# Patient Record
Sex: Male | Born: 1980 | Race: Black or African American | Hispanic: No | Marital: Married | Smoking: Current every day smoker
Health system: Southern US, Community
[De-identification: ages and names within clinical notes are randomized; demographics above are authoritative.]

## PROBLEM LIST (undated history)

## (undated) DIAGNOSIS — F329 Major depressive disorder, single episode, unspecified: Secondary | ICD-10-CM

## (undated) DIAGNOSIS — K219 Gastro-esophageal reflux disease without esophagitis: Secondary | ICD-10-CM

## (undated) DIAGNOSIS — E739 Lactose intolerance, unspecified: Secondary | ICD-10-CM

## (undated) DIAGNOSIS — F32A Depression, unspecified: Secondary | ICD-10-CM

## (undated) DIAGNOSIS — F319 Bipolar disorder, unspecified: Secondary | ICD-10-CM

## (undated) HISTORY — PX: HIP SURGERY: SHX245

## (undated) HISTORY — DX: Lactose intolerance, unspecified: E73.9

## (undated) HISTORY — DX: Gastro-esophageal reflux disease without esophagitis: K21.9

---

## 2011-08-20 ENCOUNTER — Emergency Department (HOSPITAL_COMMUNITY)
Admission: EM | Admit: 2011-08-20 | Discharge: 2011-08-20 | Disposition: A | Discharge status: Home / Self Care | Payer: Self-pay | Attending: Emergency Medicine | Admitting: Emergency Medicine

## 2011-08-20 ENCOUNTER — Emergency Department (HOSPITAL_COMMUNITY): Payer: Self-pay

## 2011-08-20 ENCOUNTER — Encounter (HOSPITAL_COMMUNITY): Payer: Self-pay | Admitting: Emergency Medicine

## 2011-08-20 DIAGNOSIS — R0789 Other chest pain: Secondary | ICD-10-CM

## 2011-08-20 DIAGNOSIS — F17200 Nicotine dependence, unspecified, uncomplicated: Secondary | ICD-10-CM | POA: Insufficient documentation

## 2011-08-20 DIAGNOSIS — R079 Chest pain, unspecified: Secondary | ICD-10-CM | POA: Insufficient documentation

## 2011-08-20 MED ORDER — IBUPROFEN 800 MG PO TABS
800.0000 mg | ORAL_TABLET | Freq: Once | ORAL | Status: AC
  Administered 2011-08-20: 800 mg via ORAL
  Filled 2011-08-20: qty 1

## 2011-08-20 MED ORDER — OXYCODONE-ACETAMINOPHEN 5-325 MG PO TABS: 1.0000 | ORAL_TABLET | ORAL | Status: AC | PRN

## 2011-08-20 MED ORDER — OXYCODONE-ACETAMINOPHEN 5-325 MG PO TABS
1.0000 | ORAL_TABLET | Freq: Once | ORAL | Status: AC
  Administered 2011-08-20: 1 via ORAL
  Filled 2011-08-20: qty 1
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
## 2011-08-20 NOTE — ED Notes (Signed)
Pt alert & oriented x4, stable gait. Pt given discharge instructions, paperwork & prescription(s). Patient instructed to stop at the registration desk to finish any additional paperwork. pt verbalized understanding. Pt left department w/ no further questions.  
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
## 2011-12-24 ENCOUNTER — Emergency Department (HOSPITAL_COMMUNITY): Payer: Self-pay

## 2011-12-24 ENCOUNTER — Emergency Department (HOSPITAL_COMMUNITY)
Admission: EM | Admit: 2011-12-24 | Discharge: 2011-12-24 | Disposition: A | Discharge status: Home / Self Care | Payer: Self-pay | Attending: Emergency Medicine | Admitting: Emergency Medicine

## 2011-12-24 ENCOUNTER — Encounter (HOSPITAL_COMMUNITY): Payer: Self-pay | Admitting: Emergency Medicine

## 2011-12-24 DIAGNOSIS — Y92838 Other recreation area as the place of occurrence of the external cause: Secondary | ICD-10-CM | POA: Insufficient documentation

## 2011-12-24 DIAGNOSIS — X500XXA Overexertion from strenuous movement or load, initial encounter: Secondary | ICD-10-CM | POA: Insufficient documentation

## 2011-12-24 DIAGNOSIS — Y9239 Other specified sports and athletic area as the place of occurrence of the external cause: Secondary | ICD-10-CM | POA: Insufficient documentation

## 2011-12-24 DIAGNOSIS — S6390XA Sprain of unspecified part of unspecified wrist and hand, initial encounter: Secondary | ICD-10-CM | POA: Insufficient documentation

## 2011-12-24 DIAGNOSIS — F17200 Nicotine dependence, unspecified, uncomplicated: Secondary | ICD-10-CM | POA: Insufficient documentation

## 2011-12-24 DIAGNOSIS — S63602A Unspecified sprain of left thumb, initial encounter: Secondary | ICD-10-CM

## 2011-12-24 DIAGNOSIS — Y9367 Activity, basketball: Secondary | ICD-10-CM | POA: Insufficient documentation

## 2011-12-24 MED ORDER — IBUPROFEN 800 MG PO TABS: 800.0000 mg | ORAL_TABLET | Freq: Three times a day (TID) | ORAL | Status: DC

## 2011-12-24 NOTE — ED Notes (Signed)
Pt c/o left thumb pain after blocking basketball yesterday.

## 2011-12-24 NOTE — ED Provider Notes (Signed)
History     CSN: [id number]  Arrival date & time 12/24/11  1191   First MD Initiated Contact with Patient 12/24/11 1033      Chief Complaint  Patient presents with  . Hand Pain    (Consider location/radiation/quality/duration/timing/severity/associated sxs/prior treatment) HPI Comments: Lawrence Whitaker presents with left thumb pain after a hyperextension injury during a basketball game yesterday.  He has used ice and tried to minimize use with no significant improvement in pain.  Pain is constant and worse with range of motion and palpation.  He denies radiation of pain.  Pt is right handed.  The history is provided by the patient.    History reviewed. No pertinent past medical history.  Past Surgical History  Procedure Date  . Hip surgery     No family history on file.  History  Substance Use Topics  . Smoking status: Current Every Day Smoker -- 1.0 packs/day  . Smokeless tobacco: Not on file  . Alcohol Use: Yes     Comment: occasionally      Review of Systems  Musculoskeletal: Positive for joint swelling and arthralgias.  Skin: Negative for wound.  Neurological: Negative for weakness and numbness.    Allergies  Review of patient's allergies indicates no known allergies.  Home Medications   Current Outpatient Rx  Name  Route  Sig  Dispense  Refill  . IBUPROFEN 800 MG PO TABS   Oral   Take 1 tablet (800 mg total) by mouth 3 (three) times daily.   21 tablet   0     BP 117/77  Pulse 69  Temp 98.2 F (36.8 C) (Oral)  Resp 18  Ht 6\' 3"  (1.905 m)  Wt 280 lb (127.007 kg)  BMI 35.00 kg/m2  SpO2 100%  Physical Exam  Constitutional: He appears well-developed and well-nourished.  HENT:  Head: Atraumatic.  Neck: Normal range of motion.  Cardiovascular:       Pulses equal bilaterally  Musculoskeletal: He exhibits tenderness.       Right shoulder: He exhibits tenderness and pain. He exhibits no deformity and normal pulse.       TTP along left thumb  proximal phalanx and mcp.  There is modest edema which limits complete ROM but pt able to perform resisted flexion and extension of the finger.  Distal sensation intact.  Less than 3 sec cap refill. No snuffbox ttp.  Neurological: He is alert. He has normal strength. He displays normal reflexes. No sensory deficit. He exhibits normal muscle tone.  Skin: Skin is warm and dry.  Psychiatric: He has a normal mood and affect.    ED Course  Procedures (including critical care time)  Labs Reviewed - No data to display Dg Finger Thumb Left  12/24/2011  *RADIOLOGY REPORT*  Clinical Data: History of possible injury with left thumb pain.  LEFT THUMB 2+V  Comparison: None.  Findings: Alignment is normal.  Joint spaces are preserved.  No fracture or dislocation is evident.  No soft tissue lesions are seen.  IMPRESSION: No fracture or dislocation is evident.   Original Report Authenticated By: Onalee Hua Call      1. Left thumb sprain       MDM  xrays reviewed and discussed with patient.  Thumb spica (velcro) applied by RN.  Encouraged ice, elevation,  Ibuprofen.  Recheck by pcp  if not improving over the next week.  Referral also given to Dr. Romeo Apple for a recheck in 1 week if not improving.  Burgess Amor, [place] 12/24/11 2222

## 2011-12-25 NOTE — ED Provider Notes (Signed)
Medical screening examination/treatment/procedure(s) were performed by non-physician practitioner and as supervising physician I was immediately available for consultation/collaboration.   Kais Monje, MD 12/25/11 0643 

## 2012-01-27 ENCOUNTER — Encounter (HOSPITAL_COMMUNITY): Payer: Self-pay | Admitting: *Deleted

## 2012-01-27 ENCOUNTER — Encounter (HOSPITAL_COMMUNITY): Payer: Self-pay | Admitting: Emergency Medicine

## 2012-01-27 ENCOUNTER — Emergency Department (HOSPITAL_COMMUNITY)
Admission: EM | Admit: 2012-01-27 | Discharge: 2012-01-27 | Disposition: A | Discharge status: Other Acute Inpatient Hospital | Payer: Self-pay | Attending: Emergency Medicine | Admitting: Emergency Medicine

## 2012-01-27 ENCOUNTER — Inpatient Hospital Stay (HOSPITAL_COMMUNITY)
Admission: AD | Admit: 2012-01-27 | Discharge: 2012-02-02 | DRG: 885 | Disposition: A | Discharge status: Home / Self Care | Payer: Federal, State, Local not specified - Other | Source: Ambulatory Visit | Attending: Psychiatry | Admitting: Psychiatry

## 2012-01-27 DIAGNOSIS — F1210 Cannabis abuse, uncomplicated: Secondary | ICD-10-CM | POA: Diagnosis present

## 2012-01-27 DIAGNOSIS — Z23 Encounter for immunization: Secondary | ICD-10-CM

## 2012-01-27 DIAGNOSIS — R45851 Suicidal ideations: Secondary | ICD-10-CM | POA: Insufficient documentation

## 2012-01-27 DIAGNOSIS — F17200 Nicotine dependence, unspecified, uncomplicated: Secondary | ICD-10-CM | POA: Insufficient documentation

## 2012-01-27 DIAGNOSIS — F3289 Other specified depressive episodes: Secondary | ICD-10-CM | POA: Insufficient documentation

## 2012-01-27 DIAGNOSIS — F329 Major depressive disorder, single episode, unspecified: Secondary | ICD-10-CM

## 2012-01-27 DIAGNOSIS — F411 Generalized anxiety disorder: Secondary | ICD-10-CM | POA: Diagnosis present

## 2012-01-27 DIAGNOSIS — F333 Major depressive disorder, recurrent, severe with psychotic symptoms: Principal | ICD-10-CM | POA: Diagnosis present

## 2012-01-27 HISTORY — DX: Depression, unspecified: F32.A

## 2012-01-27 HISTORY — DX: Major depressive disorder, single episode, unspecified: F32.9

## 2012-01-27 LAB — BASIC METABOLIC PANEL
CO2: 25 mEq/L (ref 19–32)
Glucose, Bld: 97 mg/dL (ref 70–99)
Potassium: 3.7 mEq/L (ref 3.5–5.1)
Sodium: 141 mEq/L (ref 135–145)

## 2012-01-27 LAB — CBC WITH DIFFERENTIAL/PLATELET
Lymphocytes Relative: 45 % (ref 12–46)
Lymphs Abs: 2.1 10*3/uL (ref 0.7–4.0)
Neutrophils Relative %: 42 % — ABNORMAL LOW (ref 43–77)
Platelets: 270 10*3/uL (ref 150–400)
RBC: 4.65 MIL/uL (ref 4.22–5.81)
WBC: 4.7 10*3/uL (ref 4.0–10.5)

## 2012-01-27 LAB — RAPID URINE DRUG SCREEN, HOSP PERFORMED
Barbiturates: NOT DETECTED
Opiates: NOT DETECTED
Tetrahydrocannabinol: POSITIVE — AB

## 2012-01-27 MED ORDER — ACETAMINOPHEN 325 MG PO TABS: 650.0000 mg | ORAL_TABLET | ORAL | Status: DC | PRN

## 2012-01-27 MED ORDER — TRAZODONE HCL 50 MG PO TABS
50.0000 mg | ORAL_TABLET | Freq: Every evening | ORAL | Status: DC | PRN
  Administered 2012-01-27 – 2012-01-28 (×3): 50 mg via ORAL
  Filled 2012-01-27 (×5): qty 1

## 2012-01-27 MED ORDER — IBUPROFEN 400 MG PO TABS: 600.0000 mg | ORAL_TABLET | Freq: Three times a day (TID) | ORAL | Status: DC | PRN

## 2012-01-27 MED ORDER — NICOTINE 21 MG/24HR TD PT24
21.0000 mg | MEDICATED_PATCH | Freq: Every day | TRANSDERMAL | Status: DC
  Filled 2012-01-27 (×8): qty 1

## 2012-01-27 MED ORDER — MAGNESIUM HYDROXIDE 400 MG/5ML PO SUSP: 30.0000 mL | Freq: Every day | ORAL | Status: DC | PRN

## 2012-01-27 MED ORDER — NICOTINE 21 MG/24HR TD PT24: 21.0000 mg | MEDICATED_PATCH | Freq: Every day | TRANSDERMAL | Status: DC

## 2012-01-27 MED ORDER — ONDANSETRON HCL 4 MG PO TABS: 4.0000 mg | ORAL_TABLET | Freq: Three times a day (TID) | ORAL | Status: DC | PRN

## 2012-01-27 MED ORDER — TRAZODONE HCL 50 MG PO TABS
50.0000 mg | ORAL_TABLET | Freq: Every evening | ORAL | Status: DC | PRN
  Filled 2012-01-27: qty 1

## 2012-01-27 MED ORDER — IBUPROFEN 600 MG PO TABS
600.0000 mg | ORAL_TABLET | Freq: Four times a day (QID) | ORAL | Status: DC | PRN
  Administered 2012-01-30: 600 mg via ORAL
  Filled 2012-01-27: qty 1

## 2012-01-27 MED ORDER — LORAZEPAM 1 MG PO TABS: 1.0000 mg | ORAL_TABLET | Freq: Three times a day (TID) | ORAL | Status: DC | PRN

## 2012-01-27 MED ORDER — IBUPROFEN 800 MG PO TABS: 800.0000 mg | ORAL_TABLET | Freq: Three times a day (TID) | ORAL | Status: DC

## 2012-01-27 MED ORDER — ALUM & MAG HYDROXIDE-SIMETH 200-200-20 MG/5ML PO SUSP
30.0000 mL | ORAL | Status: DC | PRN
  Administered 2012-01-30: 30 mL via ORAL

## 2012-01-27 MED ORDER — ACETAMINOPHEN 325 MG PO TABS
650.0000 mg | ORAL_TABLET | Freq: Four times a day (QID) | ORAL | Status: DC | PRN
Start: 2012-01-27 — End: 2012-02-02
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
## 2012-01-27 NOTE — ED Notes (Signed)
[facility] called at this time for transport pt is voluntary accepted to [facility] by DR. Rabi DX: maj depression 500 bed 2. Alex Leahy

## 2012-01-27 NOTE — ED Provider Notes (Signed)
History  This chart was scribed for Lawrence Gaskins, MD by Erskine Emery, ED Scribe. This patient was seen in room [facility] and the patient's care was started at 17:24.   CSN: [id number]  Arrival date & time 01/27/12  1712   First MD Initiated Contact with Patient 01/27/12 1724      Chief Complaint  Patient presents with  . Medical Clearance    The history is provided by the patient. No language interpreter was used.  Lawrence Whitaker is a 31 y.o. male who presents to the Emergency Department complaining of suicidal and homicidal ideation for a long while. Pt denies any attempts to hurt himself or anyone else recently but he has made attempts to hurt himself in the past. Pt has never been treated or admitted for depression or anxiety. He denies any associated fevers, emesis, abdominal pain, back pain, dysuria  Past Medical History  Diagnosis Date  . Depression     Past Surgical History  Procedure Date  . Hip surgery     History reviewed. No pertinent family history.  History  Substance Use Topics  . Smoking status: Current Every Day Smoker -- 1.0 packs/day  . Smokeless tobacco: Not on file  . Alcohol Use: Yes     Comment: occasionally      Review of Systems  Constitutional: Negative for fever.  Respiratory: Negative for shortness of breath.   Cardiovascular: Negative for chest pain.  Gastrointestinal: Negative for nausea, vomiting, abdominal pain and blood in stool.  Genitourinary: Negative for dysuria and difficulty urinating.  Musculoskeletal: Negative for back pain.  Neurological: Negative for weakness.  Psychiatric/Behavioral: Positive for suicidal ideas. Negative for self-injury.  All other systems reviewed and are negative.    Allergies  Review of patient's allergies indicates no known allergies.  Home Medications   Current Outpatient Rx  Name  Route  Sig  Dispense  Refill  . IBUPROFEN 800 MG PO TABS   Oral   Take 1 tablet (800 mg total) by mouth  3 (three) times daily.   21 tablet   0     Physical Exam CONSTITUTIONAL: Well developed/well nourished HEAD AND FACE: Normocephalic/atraumatic EYES: EOMI/PERRL ENMT: Mucous membranes moist NECK: supple no meningeal signs SPINE:entire spine nontender CV: S1/S2 noted, no murmurs/rubs/gallops noted LUNGS: Lungs are clear to auscultation bilaterally, no apparent distress ABDOMEN: soft, nontender, no rebound or guarding GU:no cva tenderness NEURO: Pt is awake/alert, moves all extremitiesx4 EXTREMITIES: pulses normal, full ROM SKIN: warm, color normal PSYCH: no abnormalities of mood noted   ED Course  Procedures DIAGNOSTIC STUDIES: Oxygen Saturation is 100% on room air, normal by my interpretation.    COORDINATION OF CARE: 17:50--I evaluated the patient and we discussed a treatment plan including urinalysis and telepsych consult to which the pt agreed.   7:22 PM ACT to see patient Pt stable at this time     Labs Reviewed  URINE RAPID DRUG SCREEN (HOSP PERFORMED)  ETHANOL  CBC WITH DIFFERENTIAL  BASIC METABOLIC PANEL     MDM  Nursing notes including past medical history and social history reviewed and considered in documentation Labs/vital reviewed and considered       I personally performed the services described in this documentation, which was scribed in my presence. The recorded information has been reviewed and is accurate.     Lawrence Gaskins, MD 01/27/12 Ernestina [place]

## 2012-01-27 NOTE — ED Notes (Signed)
Offered pt nicotine patch which he has declined at this time

## 2012-01-27 NOTE — Tx Team (Signed)
Initial Interdisciplinary Treatment Plan  PATIENT STRENGTHS: (choose at least two) Ability for insight Average or above average intelligence Capable of independent living General fund of knowledge Motivation for treatment/growth Supportive family/friends  PATIENT STRESSORS: Financial difficulties Marital or family conflict Occupational concerns   PROBLEM LIST: Problem List/Patient Goals Date to be addressed Date deferred Reason deferred Estimated date of resolution  Depression      Risk for self harm- improve coping skills to deal with anger      Conflict with mother of his two children                                           DISCHARGE CRITERIA:  Improved stabilization in mood, thinking, and/or behavior Motivation to continue treatment in a less acute level of care Reduction of life-threatening or endangering symptoms to within safe limits Verbal commitment to aftercare and medication compliance  PRELIMINARY DISCHARGE PLAN: Attend aftercare/continuing care group Outpatient therapy Return to previous living arrangement  PATIENT/FAMIILY INVOLVEMENT: This treatment plan has been presented to and reviewed with the patient, Lawrence Whitaker, and/or family member.  The patient and family have been given the opportunity to ask questions and make suggestions.  Jesus Genera [facility] 01/27/2012, 11:37 PM

## 2012-01-27 NOTE — Progress Notes (Signed)
PT ACCEPTED BY SPENCER SIMON,PA AT [facility] TO DR H RAVI ROOM 500-2. INFORMED DR Bebe Shaggy AND PT'S NURSE. PT WILL BE TRANSPORTED BY [facility].

## 2012-01-27 NOTE — ED Notes (Signed)
Gave patient another pair of paper scrub pants due to him tearing the ones he was wearing. Patient returned to hallway stretcher. Sitter at bedside.

## 2012-01-28 DIAGNOSIS — F333 Major depressive disorder, recurrent, severe with psychotic symptoms: Secondary | ICD-10-CM | POA: Diagnosis present

## 2012-01-28 MED ORDER — BENZTROPINE MESYLATE 0.5 MG PO TABS: 0.5000 mg | ORAL_TABLET | Freq: Two times a day (BID) | ORAL | Status: DC | PRN

## 2012-01-28 MED ORDER — SERTRALINE HCL 25 MG PO TABS
25.0000 mg | ORAL_TABLET | Freq: Every day | ORAL | Status: DC
  Administered 2012-01-28 – 2012-01-29 (×2): 25 mg via ORAL
  Filled 2012-01-28 (×4): qty 1

## 2012-01-28 MED ORDER — HALOPERIDOL 0.5 MG PO TABS
0.5000 mg | ORAL_TABLET | Freq: Two times a day (BID) | ORAL | Status: DC
  Filled 2012-01-28 (×2): qty 1

## 2012-01-28 MED ORDER — CARBAMAZEPINE 200 MG PO TABS
200.0000 mg | ORAL_TABLET | Freq: Two times a day (BID) | ORAL | Status: DC
  Filled 2012-01-28 (×3): qty 1

## 2012-01-28 NOTE — Progress Notes (Signed)
Patient in bed at the beginning of this shift; not sleeping. Writer offered Trazodone, patient stated he had never taken any sleep med in the past but willing to try. Trazodone given for sleep. Patient received medication without difficulty. Requested for cookies and something to drink. Gatorade and cookies given as requested. Q 15 minute check continues as ordered for safety.

## 2012-01-28 NOTE — Progress Notes (Signed)
[facility] LCSW Aftercare Discharge Planning Group Note        8:30-9:30 AM  01/28/2012 3:02 PM  Participation Quality:  Appropriate  Affect:  Appropriate  Cognitive:  Appropriate  Insight:  Limited  Engagement in Group:  Limited  Modes of Intervention:  Exploration, Problem-solving and Support  Summary of Progress/Problems:Patient shared he admitted to hospital with SI but currently denies SI/HI.  He reports being overwhelmed with life.  He shared he lives with mother and plans to return to the home at discharge.  He will need assistance with medication and referral for outpatient follow up.  [name] 01/28/2012, 3:02 PM

## 2012-01-28 NOTE — Progress Notes (Signed)
Psychoeducational Group Note  Date:  01/28/2012 Time:  2000   Group Topic/Focus:  Karaoke  Participation Level:  Active  Participation Quality:  Appropriate and Supportive  Affect:  Excited  Cognitive:  Alert and Appropriate  Insight:  Engaged  Engagement in Group:  Engaged  Additional Comments:    Humberto Seals Monique 01/28/2012, 10:14 PM

## 2012-01-28 NOTE — [facility] Suicide Risk Assessment (Signed)
Suicide Risk Assessment  Admission Assessment     Nursing information obtained from:  Patient Demographic factors:  Male;Adolescent or young adult;Low socioeconomic status;Unemployed Current Mental Status:  Self-harm thoughts (passive at times- denies presently) Loss Factors:  Financial problems / change in socioeconomic status Historical Factors:  Prior suicide attempts Risk Reduction Factors:  Responsible for children under 29 years of age;Sense of responsibility to family;Living with another person, especially a relative;Positive social support  CLINICAL FACTORS:   Depression:   Hopelessness Impulsivity Insomnia Severe  COGNITIVE FEATURES THAT CONTRIBUTE TO RISK:  Cognitively intact    SUICIDE RISK:   Mild:  Suicidal ideation of limited frequency, intensity, duration, and specificity.  There are no identifiable plans, no associated intent, mild dysphoria and related symptoms, good self-control (both objective and subjective assessment), few other risk factors, and identifiable protective factors, including available and accessible social support.  PLAN OF CARE: Initiate medications as needed. Encourage patient to attend groups and participate.   Ayomide Purdy 01/28/2012, 1:25 PM

## 2012-01-28 NOTE — Progress Notes (Signed)
Psychoeducational Group Note  Date:  01/28/2012 Time:  1000  Group Topic/Focus:  Overcoming Stress:   The focus of this group is to define stress and help patients assess their triggers.  Participation Level:  Active  Participation Quality:  Appropriate, Attentive and Sharing  Affect:  Appropriate  Cognitive:  Appropriate  Insight:  Engaged  Engagement in Group:  Engaged  Additional Comments:  Lawrence Whitaker participated in overcoming stress group. Patient defined stress in own terms. Patient completed a stress interview with a partner within the group. Discussed what stresses patient most, and if stress makes you angry or nervous and what happens when you feel stress out. Patient then completed managing stress ideas such as music, television, exercising, etc. Patient was appropriate and cooperative and shared during group.  Lawrence Whitaker 01/28/2012, 10:56 AM

## 2012-01-28 NOTE — Progress Notes (Signed)
D:  Patient up and active in the milieu most of the day.  He has attended and participated in all groups.  He has spent a great deal of time on the phone, and at times gets tearful/sad, but brightens easily.  He rates his depression and hopelessness both at 8 today.  He also admits to off and on thoughts of suicide or self harm.  He does agree to seek out staff if feeling unsafe.   A:  Patient started on Zoloft today.  Educated about what medication is for and gave patient a chance to ask questions.   R:  Pleasant and cooperative.  Interacting well with staff and peers.  Affect blunted and patient appears sad much of the time.

## 2012-01-28 NOTE — Progress Notes (Signed)
Psychoeducational Group Note  Date:  01/28/2012 Time:  1100  Group Topic/Focus:  Wellness Toolbox:   The focus of this group is to discuss various aspects of wellness, balancing those aspects and exploring ways to increase the ability to experience wellness.  Patients will create a wellness toolbox for use upon discharge.  Participation Level:  Active  Participation Quality:  Appropriate, Attentive, Sharing and Supportive  Affect:  Appropriate, Depressed and Tearful  Cognitive:  Alert, Appropriate, Oriented, Delusional, Hallucinating and Lacking  Insight:  Appropriate   Engagement in Group:  Supportive  Additional Comments:  Productive group  Earline Mayotte 01/28/2012, 3:56 PM

## 2012-01-28 NOTE — Progress Notes (Signed)
[facility] LCSW Group Therapy       Living a Balanced Life    1:15-2:30 PM    01/28/2012 2:57 PM  Type of Therapy:  Group Therapy  Participation Level:  Minimal  Participation Quality:  Appropriate and Attentive  Affect:  Appropriate  Cognitive:  Appropriate  Insight:  Engaged  Engagement in Therapy:  Engaged  Modes of Intervention:  Education, Exploration, Problem-solving, Dance movement psychotherapist and Support  Summary of Progress/Problems: Patient listened attentively to speaker from [employer].   He shared that he is very interested in following with their service.  Patient had shared with writer earlier that he has anger management problems and [facility] offers an anger management class.  [name] 01/28/2012, 2:57 PM

## 2012-01-28 NOTE — H&P (Signed)
Psychiatric Admission Assessment Adult  Patient Identification:  Lawrence Whitaker  Date of Evaluation:  01/28/2012  Chief Complaint:  MDD, single episode; Marijuana Abuse  History of Present Illness: This is an admission assessment for this 31 year old [place]-American male. Admitted to [facility] from the [facility] ED in [place], [place] with reports of feeling very depressed having anger problems and suicidal homicidal ideations. Patient reports, "I went to the [facility] around 5:00 PM with my mother. Prior to that, I had actually gone to the [facility] because I was feeling like killing myself and or kill any one that may be in my way. I am feeling stressed, overwhelmed, and constantly worrying about stuff. I have also anger issues. I feel paranoid from time to time, always looking over my shoulder. I cannot control my temper. I easily explode for the tinniest reason there is. I don't like to be around alot of people. I have been feeling suicidal for over a month. About 2 years ago, I attempted suicide by overdosing on Tylenol PM. But I did not die, I woke up regretting that I did not die. I have no job, a lot of financial issues. I am unable to make my monthly child support payment.  In 2007, I went to prison for possession of Marijuana. While in the prison, I was diagnosed with Bipolar disorder. But I did not make the psychiatric follow-up appointment after I got of prison. I was afraid that I was going to be locked up again if I will go any psychiatric hospital for teatment. I hear voices all the time telling me to hurt myself. This is my first psychiatric hospitalization and treatment".   Elements:  Location:  [facility] adult unit. Quality:  "I feel so mad and angry that I was having SIHI'. Severity:  "I feel like getting in my car and crash it with me in it'. Timing:  "I have been feeling this way for 2 years".. Duration:  "It has been over 1 month since my depression  worsened".. Context:  "I am constantly worrying about stuff, I don't sleep, I'm constantly looking over my shoulder, anger problems".  Associated Signs/Synptoms:  Depression Symptoms:  depressed mood, difficulty concentrating, hopelessness, suicidal thoughts with specific plan, insomnia,  (Hypo) Manic Symptoms:  Hallucinations, Impulsivity, Irritable Mood,  Anxiety Symptoms:  Excessive Worry,  Psychotic Symptoms:  Hallucinations: Auditory  PTSD Symptoms: Had a traumatic exposure:  Denies any traumatic events in his life.  Psychiatric Specialty Exam: Physical Exam  Constitutional: He is oriented to person, place, and time. He appears well-developed and well-nourished.  HENT:  Head: Normocephalic.  Eyes: Pupils are equal, round, and reactive to light.  Neck: Normal range of motion.  Cardiovascular: Normal rate.   Respiratory: Effort normal.  GI: Soft.  Musculoskeletal: Normal range of motion.  Neurological: He is alert and oriented to person, place, and time.  Skin: Skin is warm and dry.  Psychiatric: His speech is normal and behavior is normal. His mood appears anxious. Thought content is paranoid. Cognition and memory are normal. He expresses impulsivity. He exhibits a depressed mood. He expresses homicidal and suicidal ideation. He expresses no suicidal plans and no homicidal plans.    Review of Systems  Constitutional: Negative.   HENT: Negative.   Eyes: Negative.   Respiratory: Negative.   Cardiovascular: Negative.   Gastrointestinal: Negative.   Genitourinary: Negative.   Musculoskeletal: Negative.   Skin: Negative.   Neurological: Negative.   Endo/Heme/Allergies: Negative.  Psychiatric/Behavioral: Positive for depression, suicidal ideas, hallucinations and substance abuse. The patient is nervous/anxious.     Blood pressure 144/88, pulse 61, temperature 97.2 F (36.2 C), temperature source Oral, resp. rate 20, height 6' 0.5" (1.842 m), weight 128.368 kg (283  lb).Body mass index is 37.85 kg/(m^2).  General Appearance: Fairly Groomed  Patent attorney::  Good  Speech:  Clear and Coherent  Volume:  Normal  Mood:  Depressed  Affect:  Flat  Thought Process:  Coherent and Intact  Orientation:  Full (Time, Place, and Person)  Thought Content:  Hallucinations: Auditory  Suicidal Thoughts:  Yes.  without intent/plan  Homicidal Thoughts:  Yes.  without intent/plan  Memory:  Immediate;   Good Recent;   Good Remote;   Good  Judgement:  Impaired  Insight:  Fair  Psychomotor Activity:  Normal  Concentration:  Fair  Recall:  Good  Akathisia:  No  Handed:  Right  AIMS (if indicated):     Assets:  Desire for Improvement  Sleep:  Number of Hours: 5.75     Past Psychiatric History: Diagnosis: Major depressive disorder with psychotic features, Cannabis abuse   Hospitalizations: [facility]  Outpatient Care: None reported  Substance Abuse Care: None reported  Self-Mutilation: Denies  Suicidal Attempts: "yes, overdose a while ago"  Violent Behaviors: Had attempted suicide in the past.   Past Medical History:   Past Medical History  Diagnosis Date  . Depression    None.  Allergies:  No Known Allergies  PTA Medications: Prescriptions prior to admission  Medication Sig Dispense Refill  . ibuprofen (ADVIL,MOTRIN) 800 MG tablet Take 800 mg by mouth every 6 (six) hours as needed.      . [DISCONTINUED] ibuprofen (ADVIL,MOTRIN) 800 MG tablet Take 1 tablet (800 mg total) by mouth 3 (three) times daily.  21 tablet  0    Previous Psychotropic Medications:  Medication/Dose  None reported               Substance Abuse History in the last 12 months:  yes Smokes 1 blunt weekly. Drinks alcohol during the holidays, 1 shot of liquor since age 3. Smokes a pack of cigarettes daily since the age 84. Used Ectasy only once at the age of 27.   Consequences of Substance Abuse: Medical Consequences:  Liver damage, Possible death by overdose Legal  Consequences:  Arrests, jail time, Loss of driving privilege. Family Consequences:  Family discord, divorce and or separation.   Social History:  reports that he has been smoking Cigarettes.  He has a 15 pack-year smoking history. He does not have any smokeless tobacco history on file. He reports that he drinks alcohol. He reports that he uses illicit drugs (Marijuana). Additional Social History: Pain Medications: see med list Prescriptions: n/a Over the Counter: n/a History of alcohol / drug use?: Yes Longest period of sobriety (when/how long): drinks and uses only occasionally per patient Name of Substance 1: THC 1 - Age of First Use: 18 1 - Amount (size/oz): 1 blunt 1 - Frequency: occasionally 1 - Last Use / Amount: about a week ago  Current Place of Residence: [place], [place]   Place of Birth: [place]  Family Members: "My 2 boys"  Marital Status:  Single  Children: 2  Sons: 2  Daughters: 0  Relationships: single  Education:  [employer] Problems/Performance: Completed high school  Religious Beliefs/Practices: None reported  History of Abuse (Emotional/Phsycial/Sexual): Denies  Occupational Experiences: English as a second language teacher History:  None.  Legal History:  None reported  Hobbies/Interests: None reported  Family History:  History reviewed. No pertinent family history.  Results for orders placed during the hospital encounter of 01/27/12 (from the past 72 hour(s))  ETHANOL     Status: Normal   Collection Time   01/27/12  5:24 PM      Component Value Range Comment   Alcohol, Ethyl (B) <11  0 - 11 mg/dL   CBC WITH DIFFERENTIAL     Status: Abnormal   Collection Time   01/27/12  5:24 PM      Component Value Range Comment   WBC 4.7  4.0 - 10.5 K/uL    RBC 4.65  4.22 - 5.81 MIL/uL    Hemoglobin 14.5  13.0 - 17.0 g/dL    HCT 16.1  09.6 - 04.5 %    MCV 92.3  78.0 - 100.0 fL    MCH 31.2  26.0 - 34.0 pg    MCHC 33.8  30.0 - 36.0 g/dL    RDW 40.9  81.1  - 91.4 %    Platelets 270  150 - 400 K/uL    Neutrophils Relative 42 (*) 43 - 77 %    Neutro Abs 2.0  1.7 - 7.7 K/uL    Lymphocytes Relative 45  12 - 46 %    Lymphs Abs 2.1  0.7 - 4.0 K/uL    Monocytes Relative 11  3 - 12 %    Monocytes Absolute 0.5  0.1 - 1.0 K/uL    Eosinophils Relative 3  0 - 5 %    Eosinophils Absolute 0.1  0.0 - 0.7 K/uL    Basophils Relative 0  0 - 1 %    Basophils Absolute 0.0  0.0 - 0.1 K/uL   BASIC METABOLIC PANEL     Status: Normal   Collection Time   01/27/12  5:24 PM      Component Value Range Comment   Sodium 141  135 - 145 mEq/L    Potassium 3.7  3.5 - 5.1 mEq/L    Chloride 107  96 - 112 mEq/L    CO2 25  19 - 32 mEq/L    Glucose, Bld 97  70 - 99 mg/dL    BUN 9  6 - 23 mg/dL    Creatinine, Ser 7.82  0.50 - 1.35 mg/dL    Calcium 9.6  8.4 - 95.6 mg/dL    GFR calc non Af Amer >90  >90 mL/min    GFR calc Af Amer >90  >90 mL/min   URINE RAPID DRUG SCREEN (HOSP PERFORMED)     Status: Abnormal   Collection Time   01/27/12  7:20 PM      Component Value Range Comment   Opiates NONE DETECTED  NONE DETECTED    Cocaine NONE DETECTED  NONE DETECTED    Benzodiazepines NONE DETECTED  NONE DETECTED    Amphetamines NONE DETECTED  NONE DETECTED    Tetrahydrocannabinol POSITIVE (*) NONE DETECTED    Barbiturates NONE DETECTED  NONE DETECTED    Psychological Evaluations:  Assessment:   AXIS I:  Major depressive disorder with psychotic features. AXIS II:  Deferred AXIS III:   Past Medical History  Diagnosis Date  . Depression    AXIS IV:  economic problems, educational problems, occupational problems and other psychosocial or environmental problems AXIS V:  11-20 some danger of hurting self or others possible OR occasionally fails to maintain minimal personal hygiene OR gross impairment in communication  Treatment Plan/Recommendations:  Admit for safety and stabilization. Review and reinstate any pertinent home medications for other health issues. Start  Sertraline 25 mg daily for depression, and Trazodone 50 mg Q bedtime for sleep. Monitor for any adverse effects from medications. Group counseling sessions and activities.  Treatment Plan Summary: Daily contact with patient to assess and evaluate symptoms and progress in treatment Medication management  Current Medications:  Current Facility-Administered Medications  Medication Dose Route Frequency Provider Last Rate Last Dose  . acetaminophen (TYLENOL) tablet 650 mg  650 mg Oral Q6H PRN Kerry Hough, PA      . alum & mag hydroxide-simeth (MAALOX/MYLANTA) 200-200-20 MG/5ML suspension 30 mL  30 mL Oral Q4H PRN Kerry Hough, PA      . ibuprofen (ADVIL,MOTRIN) tablet 600 mg  600 mg Oral Q6H PRN Kerry Hough, PA      . magnesium hydroxide (MILK OF MAGNESIA) suspension 30 mL  30 mL Oral Daily PRN Kerry Hough, PA      . nicotine (NICODERM CQ - dosed in mg/24 hours) patch 21 mg  21 mg Transdermal Q0600 Kerry Hough, PA      . traZODone (DESYREL) tablet 50 mg  50 mg Oral QHS,MR X 1 Kerry Hough, PA   50 mg at 01/27/12 2349  . [DISCONTINUED] ibuprofen (ADVIL,MOTRIN) tablet 800 mg  800 mg Oral TID Kerry Hough, PA      . [DISCONTINUED] traZODone (DESYREL) tablet 50 mg  50 mg Oral QHS,MR X 1 Kerry Hough, PA       Facility-Administered Medications Ordered in Other Encounters  Medication Dose Route Frequency Provider Last Rate Last Dose  . [DISCONTINUED] acetaminophen (TYLENOL) tablet 650 mg  650 mg Oral Q4H PRN Joya Gaskins, MD      . [DISCONTINUED] ibuprofen (ADVIL,MOTRIN) tablet 600 mg  600 mg Oral Q8H PRN Joya Gaskins, MD      . [DISCONTINUED] LORazepam (ATIVAN) tablet 1 mg  1 mg Oral Q8H PRN Joya Gaskins, MD      . [DISCONTINUED] nicotine (NICODERM CQ - dosed in mg/24 hours) patch 21 mg  21 mg Transdermal Daily Joya Gaskins, MD      . [DISCONTINUED] ondansetron [facility]) tablet 4 mg  4 mg Oral Q8H PRN Joya Gaskins, MD        Observation  Level/Precautions:  15 minute checks  Laboratory:  Reviewed and noted ED lab findings on file.  Psychotherapy:  Group counseling sessions and activities.  Medications: See medication lists    Consultations: None indicated at this time.    Discharge Concerns:  Safety  Estimated LOS: 3-5 days  Other:     I certify that inpatient services furnished can reasonably be expected to improve the patient's condition.   Armandina Stammer I 12/12/20139:48 AM

## 2012-01-28 NOTE — Progress Notes (Signed)
Psychoeducational Group Note  Date:  01/28/2012 Time:  1400  Group Topic/Focus:  Wellness Toolbox:   The focus of this group is to discuss various aspects of wellness, balancing those aspects and exploring ways to increase the ability to experience wellness.  Patients will create a wellness toolbox for use upon discharge.  Participation Level:  Active  Participation Quality:  Appropriate, Attentive, Sharing and Supportive  Affect:  Anxious and Appropriate  Cognitive:  Alert and Appropriate  Insight:  Improving  Engagement in Group:  Supportive  Additional Comments:  Productive group  Earline Mayotte 01/28/2012, 4:06 PM

## 2012-01-29 DIAGNOSIS — F329 Major depressive disorder, single episode, unspecified: Secondary | ICD-10-CM

## 2012-01-29 MED ORDER — SERTRALINE HCL 50 MG PO TABS
50.0000 mg | ORAL_TABLET | Freq: Every day | ORAL | Status: DC
  Administered 2012-01-30 – 2012-02-02 (×4): 50 mg via ORAL
  Filled 2012-01-29 (×3): qty 1
  Filled 2012-01-29: qty 14
  Filled 2012-01-29 (×2): qty 1

## 2012-01-29 MED ORDER — HALOPERIDOL 1 MG PO TABS
ORAL_TABLET | ORAL | Status: AC
  Filled 2012-01-29: qty 1

## 2012-01-29 MED ORDER — HALOPERIDOL 1 MG PO TABS
1.0000 mg | ORAL_TABLET | Freq: Once | ORAL | Status: AC
  Administered 2012-01-29: 1 mg via ORAL

## 2012-01-29 MED ORDER — HALOPERIDOL 0.5 MG PO TABS
0.5000 mg | ORAL_TABLET | Freq: Two times a day (BID) | ORAL | Status: DC
  Administered 2012-01-30 – 2012-02-02 (×7): 0.5 mg via ORAL
  Filled 2012-01-29 (×2): qty 1
  Filled 2012-01-29: qty 28
  Filled 2012-01-29 (×3): qty 1
  Filled 2012-01-29: qty 28
  Filled 2012-01-29 (×4): qty 1

## 2012-01-29 MED ORDER — TRAZODONE HCL 100 MG PO TABS
100.0000 mg | ORAL_TABLET | Freq: Every evening | ORAL | Status: DC | PRN
  Administered 2012-01-29 – 2012-01-31 (×6): 100 mg via ORAL
  Filled 2012-01-29 (×8): qty 1

## 2012-01-29 MED ORDER — BENZTROPINE MESYLATE 0.5 MG PO TABS
0.5000 mg | ORAL_TABLET | Freq: Two times a day (BID) | ORAL | Status: DC
  Administered 2012-01-29 – 2012-02-02 (×9): 0.5 mg via ORAL
  Filled 2012-01-29: qty 1
  Filled 2012-01-29: qty 28
  Filled 2012-01-29 (×6): qty 1
  Filled 2012-01-29: qty 28
  Filled 2012-01-29 (×5): qty 1

## 2012-01-29 MED ORDER — HALOPERIDOL 0.5 MG PO TABS: 0.5000 mg | ORAL_TABLET | Freq: Two times a day (BID) | ORAL | Status: DC

## 2012-01-29 NOTE — Progress Notes (Signed)
D:Pt anxious pacing in hall, leaving group early and reports si and hi to anyone. Pt reports that he has had anger issues since a child. He has taken anger management classes and other treatments in the past.  A:Supported pt to discuss feelings. Reported to NP and received order for haldol. Gave medication as ordered. Pt went to the quiet room where he punched the wall. Rt hand is slightly swollen. Reported to MD. Tommi Rumps escalated pt and assisted pt with deep breathing. R:Pt's mood calmer after de escalation and medication. Pt contracts for safety.

## 2012-01-29 NOTE — Progress Notes (Signed)
[facility] MD Progress Note  01/29/2012 12:18 PM Lawrence Whitaker  MRN:  [id number] Subjective:  Patient seen today. Reports tolerating the Zoloft well. This morning he reports severe panic symptoms and having a panic attack with heart pounding, feeling like he needed to punch something. States he has had these episodes frequently. Reports high anxiety in crowds. Diagnosis:   Axis I: Depressive Disorder NOS and Panic Disorder Axis II: Deferred Axis III:  Past Medical History  Diagnosis Date  . Depression    Axis IV: housing problems and occupational problems Axis V: 51-60 moderate symptoms  ADL's:  Intact  Sleep: Fair  Appetite:  Fair   Psychiatric Specialty Exam: Review of Systems  Constitutional: Negative.   HENT: Negative.   Eyes: Negative.   Respiratory: Negative.   Cardiovascular: Negative.   Gastrointestinal: Negative.   Genitourinary: Negative.   Musculoskeletal: Negative.   Skin: Negative.   Neurological: Negative.   Endo/Heme/Allergies: Negative.   Psychiatric/Behavioral: Positive for depression and suicidal ideas. The patient is nervous/anxious.     Blood pressure 122/74, pulse 69, temperature 98 F (36.7 C), temperature source Oral, resp. rate 18, height 6' 0.5" (1.842 m), weight 128.368 kg (283 lb).Body mass index is 37.85 kg/(m^2).  General Appearance: Casual  Eye Contact::  Fair  Speech:  Clear and Coherent  Volume:  Increased  Mood:  Anxious, Depressed and Dysphoric  Affect:  Depressed  Thought Process:  Coherent  Orientation:  Full (Time, Place, and Person)  Thought Content:  WDL  Suicidal Thoughts:  No  Homicidal Thoughts:  No  Memory:  Immediate;   Fair Recent;   Fair Remote;   Fair  Judgement:  Fair  Insight:  Present  Psychomotor Activity:  Normal  Concentration:  Fair  Recall:  Fair  Akathisia:  No  Handed:  Right  AIMS (if indicated):     Assets:  Communication Skills Desire for Improvement  Sleep:  Number of Hours: 5.25    Current  Medications: Current Facility-Administered Medications  Medication Dose Route Frequency Provider Last Rate Last Dose  . acetaminophen (TYLENOL) tablet 650 mg  650 mg Oral Q6H PRN Kerry Hough, PA      . alum & mag hydroxide-simeth (MAALOX/MYLANTA) 200-200-20 MG/5ML suspension 30 mL  30 mL Oral Q4H PRN Kerry Hough, PA      . benztropine (COGENTIN) tablet 0.5 mg  0.5 mg Oral BID Sanjuana Kava, NP   0.5 mg at 01/29/12 0954  . haloperidol (HALDOL) 1 MG tablet           . haloperidol (HALDOL) tablet 0.5 mg  0.5 mg Oral BID Sanjuana Kava, NP      . ibuprofen (ADVIL,MOTRIN) tablet 600 mg  600 mg Oral Q6H PRN Kerry Hough, PA      . magnesium hydroxide (MILK OF MAGNESIA) suspension 30 mL  30 mL Oral Daily PRN Kerry Hough, PA      . nicotine (NICODERM CQ - dosed in mg/24 hours) patch 21 mg  21 mg Transdermal Q0600 Kerry Hough, PA      . sertraline (ZOLOFT) tablet 25 mg  25 mg Oral Daily Sanjuana Kava, NP   25 mg at 01/29/12 0807  . traZODone (DESYREL) tablet 100 mg  100 mg Oral QHS,MR X 1 Sanjuana Kava, NP        Lab Results:  Results for orders placed during the hospital encounter of 01/27/12 (from the past 48 hour(s))  TSH  Status: Normal   Collection Time   01/28/12  6:15 AM      Component Value Range Comment   TSH 1.389  0.350 - 4.500 uIU/mL     Physical Findings: AIMS: Facial and Oral Movements Muscles of Facial Expression:  (no abnormal expression noted, eyes close resp. even) Lips and Perioral Area: None, normal Jaw: None, normal Tongue: None, normal,Extremity Movements Upper (arms, wrists, hands, fingers): None, normal Lower (legs, knees, ankles, toes): None, normal, Trunk Movements Neck, shoulders, hips: None, normal, Overall Severity Severity of abnormal movements (highest score from questions above): None, normal Incapacitation due to abnormal movements: None, normal Patient's awareness of abnormal movements (rate only patient's report): No Awareness, Dental  Status Current problems with teeth and/or dentures?: No Does patient usually wear dentures?: No  CIWA:  CIWA-Ar Total: 0  COWS:     Treatment Plan Summary: Daily contact with patient to assess and evaluate symptoms and progress in treatment Medication management  Plan: Increase Zoloft to 50mg  po qd. Start Haldol at 0.5mg  po bid.  Medical Decision Making Problem Points:  Established problem, stable/improving (1), New problem, with no additional work-up planned (3), Review of last therapy session (1) and Review of psycho-social stressors (1) Data Points:  Review of medication regiment & side effects (2) Review of new medications or change in dosage (2)  I certify that inpatient services furnished can reasonably be expected to improve the patient's condition.   Lawrence Whitaker 01/29/2012, 12:18 PM

## 2012-01-29 NOTE — [facility] Counselor (Signed)
Adult Comprehensive Assessment  Patient ID: Lawrence Whitaker, male   DOB: 12-Feb-1981, 31 y.o.   MRN: [id number]  Information Source: Information source: Patient  Current Stressors:  Educational / Learning stressors: None Employment / Job issues: Unemployed Family Relationships: Problems with Musician / Lack of resources (include bankruptcy): Unable to provide for self and children due to no income Housing / Lack of housing: None Physical health (include injuries & life threatening diseases): None Social relationships: Problems getting along with people due to anger Substance abuse: Abusing THC Bereavement / Loss: None  Living/Environment/Situation:  Living Arrangements: Parent Living conditions (as described by patient or guardian): Good How long has patient lived in current situation?: threeyears What is atmosphere in current home: Comfortable;Loving  Family History:  Marital status: Single Does patient have children?: Yes How many children?: 2  How is patient's relationship with their children?: Very good  Childhood History:  By whom was/is the patient raised?: Mother Additional childhood history information: Father was not in his life Description of patient's relationship with caregiver when they were a child: Reports father was very negligent in spending time with him or providing for his needs Patient's description of current relationship with people who raised him/her: Great relationship with mother Does patient have siblings?: Yes Number of Siblings: 2  Description of patient's current relationship with siblings: Good family relatinship[s Did patient suffer any verbal/emotional/physical/sexual abuse as a child?: No Did patient suffer from severe childhood neglect?: No Has patient ever been sexually abused/assaulted/raped as an adolescent or adult?: No Was the patient ever a victim of a crime or a disaster?: No Witnessed domestic violence?:  (Patient reports he  has temper - fights with ex-girlfriend) Has patient been effected by domestic violence as an adult?: No  Education:  Highest grade of school patient has completed: Year and half of colleg Currently a student?: No Learning disability?: No  Employment/Work Situation:   Employment situation: Unemployed Patient's job has been impacted by current illness: No What is the longest time patient has a held a job?: four years Where was the patient employed at that time?: [employer] Has patient ever been in the [employer]?: No  Financial Resources:      Alcohol/Substance Abuse:   If attempted suicide, did drugs/alcohol play a role in this?: No Alcohol/Substance Abuse Treatment Hx: Denies past history If yes, describe treatment: Patient endorses smoking THC ocassionally Has alcohol/substance abuse ever caused legal problems?: No  Social Support System:   Patient's Community Support System: Good Describe Community Support System: 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 Type of faith/religion: Christian How does patient's faith help to cope with current illness?: Does not apply faith  Leisure/Recreation:   Leisure and Hobbies: Social worker:   What things does the patient do well?: Good father In what areas does patient struggle / problems for patient: Anger and employment  Discharge Plan:   Does patient have access to transportation?: Yes Will patient be returning to same living situation after discharge?: Yes Currently receiving community mental health services: No Does patient have financial barriers related to discharge medications?: Yes Patient description of barriers related to discharge medications: Patient is uninsured  Summary/Recommendations:  Lawrence Whitaker is a 31 years old African American Male.  He admitted with MDD and THC abuse.  He will Patient will benefit from crisis stabilization, evaluation for medication management, psycho education  groups for coping skills development, group therapy and assistance with discharge planning.  Lawrence Whitaker, Lawrence Whitaker. 01/29/2012

## 2012-01-29 NOTE — Progress Notes (Signed)
Psychoeducational Group Note  Date:  01/29/2012 Time:  1100  Group Topic/Focus:  Relapse Prevention Planning:   The focus of this group is to define relapse and discuss the need for planning to combat relapse.  Participation Level:  Active  Participation Quality:  Appropriate  Affect:  Appropriate  Cognitive:  Appropriate  Insight:  Engaged  Engagement in Group:  Engaged  Additional Comments:  Pt. Participated in relapse prevention/ love language group.  Giamarie Bueche A 01/29/2012, 12:00 PM

## 2012-01-29 NOTE — Progress Notes (Signed)
D.  Pt. Has had no aggressive behavior this evening.  Pt. Reports when he awoke this morning he was feeling shaky but is feeling much better now.  Pt. Has been interacting appropriately with peers and staff.  Denies SI/HI and denies A/V hallucinations at present. A.  Pt. Encouraged to report feelings to staff if he is having anxiety so that staff can help him stay in control R.  Pt. Receptive and remains safe.

## 2012-01-29 NOTE — Progress Notes (Signed)
Patient ID: Lawrence Whitaker, male   DOB: 09/25/1980, 31 y.o.   MRN: [id number] D: Pt. In bed eyes closed, resp. Even. A: Writer will assess for s/s of distress. Staff will monitor q74min for safety. R: No distress noted res. Even/unlabored. Pt. Is safe on the unit.

## 2012-01-29 NOTE — Progress Notes (Signed)
[facility] LCSW Group Therapy        Feelings Around Relapse         1:15-2:30 PM  01/29/2012 12:25 PM  Type of Therapy:  Group Therapy  Participation Level:  Active  Participation Quality:  Appropriate and Attentive  Affect:  Appropriate  Cognitive:  Appropriate  Insight:  Engaged  Engagement in Therapy:  Engaged  Modes of Intervention:  Exploration, Problem-solving and Support  Summary of Progress/Problems:  Patient shared relapsing for him is returning to stupid behaviors.  He was able to identify how smoking THC leads to positive drug testing and not getting a job.  [name] 01/29/2012, 12:25 PM

## 2012-01-29 NOTE — Progress Notes (Signed)
[facility] LCSW Aftercare Discharge Planning Group Note       8:30-9:30 AM    01/29/2012 10:20 AM  Participation Quality:    Affect:    Cognitive:    Insight:    Engagement in Group:    Modes of Intervention:  Exploration, Problem-solving, support  Summary of Progress/Problems:  Patient left group early without participating.  Lawrence Whitaker 01/29/2012, 10:20 AM

## 2012-01-30 MED ORDER — ONDANSETRON 4 MG PO TBDP
8.0000 mg | ORAL_TABLET | Freq: Three times a day (TID) | ORAL | Status: DC | PRN
  Administered 2012-01-30 – 2012-02-02 (×2): 8 mg via ORAL
  Filled 2012-01-30: qty 2

## 2012-01-30 MED ORDER — FLUTICASONE PROPIONATE 50 MCG/ACT NA SUSP
1.0000 | Freq: Two times a day (BID) | NASAL | Status: DC
  Administered 2012-01-30 – 2012-02-02 (×6): 1 via NASAL
  Filled 2012-01-30 (×2): qty 16

## 2012-01-30 NOTE — Progress Notes (Signed)
Goals Group. A goal is set for the day that is measurable. Pt's goal today was to "not spaz out".

## 2012-01-30 NOTE — Progress Notes (Signed)
[facility] Group Notes:  (Counselor/Nursing/MHT/Case Management/Adjunct)  01/30/2012 9:57 PM  Type of Therapy:  Psychoeducational Skills  Participation Level:  Active  Participation Quality:  Appropriate  Affect:  Appropriate  Cognitive:  Appropriate  Insight:  Supportive  Engagement in Group:  Engaged  Engagement in Therapy:  Engaged  Modes of Intervention:  Education  Summary of Progress/Problems: The mentioned in group this evening that he had a good day. He states that he felt nauseated at times,but managed to go downstairs to the gym for exercising. His goal for tomorrow is to get more rest.    Lorianne Malbrough S 01/30/2012, 9:57 PM

## 2012-01-30 NOTE — Progress Notes (Signed)
D: Pt in bed resting with eyes closed. Respirations even and unlabored. Pt appears to be in no signs of distress at this time. A: Q15min checks remains for this pt. R: Pt remains safe at this time.   

## 2012-01-30 NOTE — Progress Notes (Signed)
Pt went to the gym to play ball. He states he thinks he has a sinus infection and feels very nauseated. Phone NP and waiting for her to call back. Pt was given some gingerale. He stated he has had a good day as his GF is coming this pm to visit. Pt appears in good spirits and contracts for safety. No SI or HI.Pt is not angry or agitated this pm. Pt continues to remain safe on q 15 minute checks. He is cooperative and has been attending groups .

## 2012-01-30 NOTE — Progress Notes (Addendum)
Lawrence Whitaker is seen out in the milieu today.Marland KitchenHe is observed laughing loudly , clapping and joking with several other patients on the hall. HE laughs as he comes to the med window to take his AM meds. He completes his AM self inventory and on it he wrote he denied SI within the past 24 hrs. HE rated his depression and hopelessness " 3/0", respectively and he said his DC plan is to : " try to stay with life and stay on my meds".   A He is seen attending his AM group this morning as planned.   R Safety is in place and therapeutic relationship is fostered.

## 2012-01-30 NOTE — Progress Notes (Signed)
[name] and cooperative.  Reports that he has had a good day and attended group.  Denies SI/HI and denies  A/V hallucinations. A.  Encouragement and support given. R.  Pt. Receptive.

## 2012-01-30 NOTE — Progress Notes (Signed)
[facility] MD Progress Note  01/30/2012 2:58 PM Lawrence Whitaker  MRN:  [id number]  Subjective:  "The haldol is helping me. The voices are quite gone. I am not as anxious. My mood is improving. I feel good. I woke up yesterday feeling very bad, my thought was racing and everything, But today, I feel really good about things"  Diagnosis:   Axis I: Major depressive disorder, recurrent episode, severe, with psychotic behavior Axis II: Deferred Axis III:  Past Medical History  Diagnosis Date  . Depression    Axis IV: other psychosocial or environmental problems Axis V: 41-50 serious symptoms  ADL's:  Intact  Sleep: Good  Appetite:  Good  Suicidal Ideation:  Denies Homicidal Ideation:  Denies  AEB (as evidenced by): Per patient's reports.  Psychiatric Specialty Exam: Review of Systems  Constitutional: Negative.   HENT: Negative.   Eyes: Negative.   Respiratory: Positive for cough (and running nose).   Cardiovascular: Negative.   Gastrointestinal: Negative.   Genitourinary: Negative.   Musculoskeletal: Negative.   Skin: Negative.   Neurological: Negative.   Endo/Heme/Allergies: Negative.   Psychiatric/Behavioral: Positive for depression (Cuurently being stabilized with medication.). Negative for suicidal ideas, hallucinations and memory loss. The patient is not nervous/anxious and does not have insomnia.     Blood pressure 148/88, pulse 79, temperature 98.3 F (36.8 C), temperature source Oral, resp. rate 16, height 6' 0.5" (1.842 m), weight 128.368 kg (283 lb).Body mass index is 37.85 kg/(m^2).  General Appearance: Fairly Groomed  Patent attorney::  Good  Speech:  Clear and Coherent  Volume:  Normal  Mood: "Improving"  Affect:  Appropriate  Thought Process:  Coherent  Orientation:  Full (Time, Place, and Person)  Thought Content:  Hallucinations: None  Suicidal Thoughts:  No  Homicidal Thoughts:  No  Memory:  Immediate;   Good Recent;   Good Remote;   Good  Judgement:   Good  Insight:  Good  Psychomotor Activity:  Normal  Concentration:  Good  Recall:  Good  Akathisia:  No  Handed:  Right  AIMS (if indicated):     Assets:  Desire for Improvement  Sleep:  Number of Hours: 5.75    Current Medications: Current Facility-Administered Medications  Medication Dose Route Frequency Provider Last Rate Last Dose  . acetaminophen (TYLENOL) tablet 650 mg  650 mg Oral Q6H PRN Kerry Hough, PA      . alum & mag hydroxide-simeth (MAALOX/MYLANTA) 200-200-20 MG/5ML suspension 30 mL  30 mL Oral Q4H PRN Kerry Hough, PA      . benztropine (COGENTIN) tablet 0.5 mg  0.5 mg Oral BID Sanjuana Kava, NP   0.5 mg at 01/30/12 0849  . haloperidol (HALDOL) tablet 0.5 mg  0.5 mg Oral BID Sanjuana Kava, NP   0.5 mg at 01/30/12 0848  . ibuprofen (ADVIL,MOTRIN) tablet 600 mg  600 mg Oral Q6H PRN Kerry Hough, PA   600 mg at 01/30/12 0703  . magnesium hydroxide (MILK OF MAGNESIA) suspension 30 mL  30 mL Oral Daily PRN Kerry Hough, PA      . nicotine (NICODERM CQ - dosed in mg/24 hours) patch 21 mg  21 mg Transdermal Q0600 Kerry Hough, PA      . sertraline (ZOLOFT) tablet 50 mg  50 mg Oral Daily Himabindu Ravi, MD   50 mg at 01/30/12 0849  . traZODone (DESYREL) tablet 100 mg  100 mg Oral QHS,MR X 1 Sanjuana Kava, NP  100 mg at 01/29/12 2242    Lab Results: No results found for this or any previous visit (from the past 48 hour(s)).  Physical Findings: AIMS: Facial and Oral Movements Muscles of Facial Expression:  (no abnormal expression noted, eyes close resp. even) Lips and Perioral Area: None, normal Jaw: None, normal Tongue: None, normal,Extremity Movements Upper (arms, wrists, hands, fingers): None, normal Lower (legs, knees, ankles, toes): None, normal, Trunk Movements Neck, shoulders, hips: None, normal, Overall Severity Severity of abnormal movements (highest score from questions above): None, normal Incapacitation due to abnormal movements: None,  normal Patient's awareness of abnormal movements (rate only patient's report): No Awareness, Dental Status Current problems with teeth and/or dentures?: No Does patient usually wear dentures?: No  CIWA:  CIWA-Ar Total: 0  COWS:     Treatment Plan Summary: Daily contact with patient to assess and evaluate symptoms and progress in treatment Medication management  Plan: Start Sudafed 30 mg Q 4 hours PRN for Rhinitis. Continue current treatment plan.  Medical Decision Making  Problem Points:  Established problem, stable/improving (1), New problem, with additional work-up planned (4), Review of last therapy session (1) and Review of psycho-social stressors (1)  Data Points:  Review and summation of old records (2) Review of medication regiment & side effects (2) Review of new medications or change in dosage (2)  I certify that inpatient services furnished can reasonably be expected to improve the patient's condition.   Armandina Stammer I 01/30/2012, 2:58 PM

## 2012-01-30 NOTE — Clinical Social Work Note (Signed)
[facility] Group Notes:  (Clinical Social Work)  01/30/2012   3:00-4:00PM  Summary of Progress/Problems:   The main focus of today's process group was for the patient to identify ways in which they have in the past sabotaged their own recovery and reasons they may have done this/what they received from doing it.  We then worked to identify a specific plan to avoid doing this when discharged from the hospital for this admission.  The patient expressed that he has a long-term issue with anger, and that at some point he learned that he could prevent taking out his anger physically on others by inflicting pain on himself.  He participated in the entire discussion fully, but at end of group expressed lack of comprehension about what thoughts he may be having that lead to the self-sabotaging behavior.  We worked on this with help of group, and he recognized that he tells himself "the only way you can keep from hurt others is to hurt yourself," and he is going to work on a neutralizing thought for this.  Type of Therapy:  Group Therapy - Process  Participation Level:  Active  Participation Quality:  Attentive and Sharing  Affect:  Appropriate  Cognitive:  Alert, Appropriate and Oriented  Insight:  Engaged  Engagement in Therapy:  Engaged  Modes of Intervention:  Clarification, Education, Limit-setting, Problem-solving, Socialization, Support and Processing, Exploration, Discussion   Ambrose Mantle, LCSW 01/30/2012, 4:43 PM

## 2012-01-31 DIAGNOSIS — F333 Major depressive disorder, recurrent, severe with psychotic symptoms: Principal | ICD-10-CM

## 2012-01-31 MED ORDER — PSEUDOEPHEDRINE HCL 30 MG PO TABS
30.0000 mg | ORAL_TABLET | ORAL | Status: DC | PRN
  Administered 2012-01-31 – 2012-02-01 (×6): 30 mg via ORAL
  Filled 2012-01-31 (×6): qty 1

## 2012-01-31 NOTE — Progress Notes (Signed)
[facility] Group Notes:  (Counselor/Nursing/MHT/Case Management/Adjunct)  01/31/2012 11:55 PM  Type of Therapy:  Psychoeducational Skills  Participation Level:  Minimal  Participation Quality:  Attentive  Affect:  Appropriate  Cognitive:  Lacking  Insight:  Lacking  Engagement in Group:  Lacking  Engagement in Therapy:  Lacking  Modes of Intervention:  Education  Summary of Progress/Problems:The patient was very vague in terms of discussing his day. He would only say that he went to the gym this afternoon. His goal for tomorrow is to get discharged.    Lawrence Whitaker 01/31/2012, 11:55 PM

## 2012-01-31 NOTE — Progress Notes (Signed)
D Bert is seen out in the milieu...interacting with others appropriately. HE is cooperative. HE is pleasant. HE takes his meds as ordered. HE  Completed his AM self inventory and on it he wrote he denied SI, he rated his depression and hopelessness "1/1" and he statede his DC plan is to: " stay away from people and stressors who make me want to spaz out".  A He attends his groups as outlined in POC.  R Safety is in place and POC cont.

## 2012-01-31 NOTE — Progress Notes (Signed)
[facility] MD Progress Note  01/31/2012 10:29 AM Lawrence Whitaker  MRN:  [id number] Subjective:  "I feel pretty good, besides the congestion, the best I've felt in quite a while." Diagnosis:  Major depressive disorder, recurrent episode, severe, with psychotic behavior ADL's:  Intact  Sleep: Good  Appetite:  Good  Suicidal Ideation:  No Homicidal Ideation:  no AEB (as evidenced by): patient's verbal response, affect and written response on daily inventory  Psychiatric Specialty Exam: Review of Systems  HENT: Positive for congestion.   All other systems reviewed and are negative.    Blood pressure 111/72, pulse 85, temperature 98.3 F (36.8 C), temperature source Oral, resp. rate 16, height 6' 0.5" (1.842 m), weight 128.368 kg (283 lb).Body mass index is 37.85 kg/(m^2).  General Appearance: Casual  Eye Contact::  Good  Speech:  Clear and Coherent  Volume:  Normal  Mood:  Depressed 1/10  Affect:  Congruent  Thought Process:  Goal Directed  Orientation:  Full (Time, Place, and Person)  Thought Content:  Hallucinations: Auditory  Have resolved  Suicidal Thoughts:  No  Homicidal Thoughts:  No  Memory:  Immediate;   Fair  Judgement:  Intact  Insight:  Present  Psychomotor Activity:  Normal  Concentration:  Fair  Recall:  Fair  Akathisia:  No  Handed:  Right  AIMS (if indicated):     Assets:  Communication Skills Desire for Improvement  Sleep:  Number of Hours: 5.75    Current Medications: Current Facility-Administered Medications  Medication Dose Route Frequency Provider Last Rate Last Dose  . acetaminophen (TYLENOL) tablet 650 mg  650 mg Oral Q6H PRN Kerry Hough, PA      . alum & mag hydroxide-simeth (MAALOX/MYLANTA) 200-200-20 MG/5ML suspension 30 mL  30 mL Oral Q4H PRN Kerry Hough, PA   30 mL at 01/30/12 2148  . benztropine (COGENTIN) tablet 0.5 mg  0.5 mg Oral BID Sanjuana Kava, NP   0.5 mg at 01/31/12 6295  . fluticasone (FLONASE) 50 MCG/ACT nasal spray 1 spray   1 spray Each Nare BID Shuvon Rankin, NP   1 spray at 01/31/12 0828  . haloperidol (HALDOL) tablet 0.5 mg  0.5 mg Oral BID Sanjuana Kava, NP   0.5 mg at 01/31/12 2841  . ibuprofen (ADVIL,MOTRIN) tablet 600 mg  600 mg Oral Q6H PRN Kerry Hough, PA   600 mg at 01/30/12 0703  . magnesium hydroxide (MILK OF MAGNESIA) suspension 30 mL  30 mL Oral Daily PRN Kerry Hough, PA      . nicotine (NICODERM CQ - dosed in mg/24 hours) patch 21 mg  21 mg Transdermal Q0600 Kerry Hough, PA      . ondansetron (ZOFRAN-ODT) disintegrating tablet 8 mg  8 mg Oral Q8H PRN Shuvon Rankin, NP   8 mg at 01/30/12 1820  . pseudoephedrine (SUDAFED) tablet 30 mg  30 mg Oral Q4H PRN Sanjuana Kava, NP   30 mg at 01/31/12 3244  . sertraline (ZOLOFT) tablet 50 mg  50 mg Oral Daily Himabindu Ravi, MD   50 mg at 01/31/12 0828  . traZODone (DESYREL) tablet 100 mg  100 mg Oral QHS,MR X 1 Sanjuana Kava, NP   100 mg at 01/30/12 2254    Lab Results: No results found for this or any previous visit (from the past 48 hour(s)).  Physical Findings: AIMS: Facial and Oral Movements Muscles of Facial Expression:  (no abnormal expression noted, eyes close resp. even) Lips  and Perioral Area: None, normal Jaw: None, normal Tongue: None, normal,Extremity Movements Upper (arms, wrists, hands, fingers): None, normal Lower (legs, knees, ankles, toes): None, normal, Trunk Movements Neck, shoulders, hips: None, normal, Overall Severity Severity of abnormal movements (highest score from questions above): None, normal Incapacitation due to abnormal movements: None, normal Patient's awareness of abnormal movements (rate only patient's report): No Awareness, Dental Status Current problems with teeth and/or dentures?: No Does patient usually wear dentures?: No  CIWA:  CIWA-Ar Total: 0  COWS:     Treatment Plan Summary: Daily contact with patient to assess and evaluate symptoms and progress in treatment Medication management  Plan: 1.  Continue current plan of care. 2. Upon discharge patient plans to return home. 3. Anticipate d/c Monday or Tuesday. 4. Patient states he will follow up with [facility] in [place]. Medical Decision Making Problem Points:  Established problem, stable/improving (1) Data Points:  Review or order clinical lab tests (1) Review of medication regiment & side effects (2)  I certify that inpatient services furnished can reasonably be expected to improve the patient's condition.  Rona Ravens. Karan Inclan PAC 01/31/2012, 10:29 AM

## 2012-01-31 NOTE — Progress Notes (Signed)
Writer received report from Druscilla Brownie. Writer entered patients room and observed them lying in bed awake talking to each other. Patient received his scheduled medications earlier and voiced no complaints. Safety maintained on unit with 15 min checks, will continue to monitor.

## 2012-01-31 NOTE — Clinical Social Work Note (Signed)
[facility] Group Notes:  (Clinical Social Work)  01/31/2012   3:00-4:00PM  Summary of Progress/Problems:   Summary of Progress/Problems:   The main focus of today's process group was for the patient to define "support" and describe what healthy supports are, then to identify the patient's current support system and decide on other supports that can be put in place to prevent future hospitalizations.  Roleplay was used to demonstrate definitions of different types of available supports.  An emphasis was placed on using therapist, doctor, therapy groups, self-help groups and problem-specific support groups to expand supports. Additionally, psychoeducation on various symptoms of mental illness was done at patients' requests.  The patient expressed full understanding of concepts presented, and was forthright with group about his own auditory hallucinations, reporting to them what it feels like and that it is real and not made-up.  Type of Therapy:  Process Group  Participation Level:  Active  Participation Quality:  Attentive and Sharing  Affect:  Appropriate and Blunted  Cognitive:  Appropriate and Oriented  Insight:  Engaged  Engagement in Therapy:  Engaged  Modes of Intervention:  Clarification, Education, Limit-setting, Problem-solving, Socialization, Support and Processing, Exploration, Discussion, Role-Play   Ambrose Mantle, LCSW 01/31/2012, 4:32 PM

## 2012-01-31 NOTE — Progress Notes (Signed)
Psychoeducational Group Note  Date: 01/31/2012 Time: 1015  Group Topic/Focus:  Making Healthy Choices:   The focus of this group is to help patients identify negative/unhealthy choices they were using prior to admission and identify positive/healthier coping strategies to replace them upon discharge.  Participation Level:  Active  Participation Quality:  Appropriate  Affect:  Appropriate  Cognitive:  Appropriate  Insight:  Improving  Engagement in Group:  Improving  Additional Comments:    01/31/2012,1:52 PM Lawrence Whitaker   

## 2012-02-01 MED ORDER — TRAZODONE HCL 100 MG PO TABS
300.0000 mg | ORAL_TABLET | Freq: Every evening | ORAL | Status: DC | PRN
  Administered 2012-02-01 (×2): 300 mg via ORAL
  Filled 2012-02-01 (×4): qty 3

## 2012-02-01 NOTE — Progress Notes (Signed)
[facility] LCSW Aftercare Discharge Planning Group Note       8:30-9:30 AM   02/01/2012 3:47 PM  Participation Quality:  Appropriate  Affect:  Appropriate  Cognitive:  Appropriate  Insight:  Engaged  Engagement in Group:  Engaged  Modes of Intervention:  Exploration, Problem-solving and Support  Summary of Progress/Problems:  Patient reported doing much and hoped to discharge today.  He rates depression and other symptoms at one.  Patient shared he has learned he is not the only person with problems.  [name] 02/01/2012, 3:47 PM

## 2012-02-01 NOTE — Progress Notes (Signed)
[facility] MD Progress Note  02/01/2012 12:00 PM Lawrence Whitaker  MRN:  [id number]  Subjective: Patient reports he has been unable to sleep even on the 200mg  of Trazodone. Tolerating his medications well, anxiety improving.  Diagnosis:   Axis I: Anxiety Disorder NOS Axis II: Deferred Axis III:  Past Medical History  Diagnosis Date  . Depression    Axis IV: occupational problems Axis V: 51-60 moderate symptoms  ADL's:  Intact  Sleep: poor  Appetite:  Fair   Psychiatric Specialty Exam: Review of Systems  Constitutional: Negative.   Eyes: Negative.   Respiratory: Negative.   Cardiovascular: Negative.   Gastrointestinal: Positive for nausea.  Genitourinary: Negative.   Musculoskeletal: Negative.   Skin: Negative.   Neurological: Positive for headaches.  Endo/Heme/Allergies: Negative.   Psychiatric/Behavioral: The patient is nervous/anxious and has insomnia.     Blood pressure 109/73, pulse 73, temperature 97.1 F (36.2 C), temperature source Oral, resp. rate 18, height 6' 0.5" (1.842 m), weight 128.368 kg (283 lb).Body mass index is 37.85 kg/(m^2).  General Appearance: Disheveled  Eye Contact::  Minimal  Speech:  Loud  Volume:  Increased  Mood:  Dysphoric  Affect:  Flat  Thought Process:  Coherent  Orientation:  Full (Time, Place, and Person)  Thought Content:  WDL  Suicidal Thoughts:  No  Homicidal Thoughts:  No  Memory:  Immediate;   Fair Recent;   Fair Remote;   Fair  Judgement:  Fair  Insight:  Present  Psychomotor Activity:  Normal  Concentration:  Fair  Recall:  Fair  Akathisia:  No  Handed:  Right  AIMS (if indicated):     Assets:  Communication Skills Desire for Improvement  Sleep:  Number of Hours: 6    Current Medications: Current Facility-Administered Medications  Medication Dose Route Frequency Provider Last Rate Last Dose  . acetaminophen (TYLENOL) tablet 650 mg  650 mg Oral Q6H PRN Kerry Hough, PA      . alum & mag hydroxide-simeth  (MAALOX/MYLANTA) 200-200-20 MG/5ML suspension 30 mL  30 mL Oral Q4H PRN Kerry Hough, PA   30 mL at 01/30/12 2148  . benztropine (COGENTIN) tablet 0.5 mg  0.5 mg Oral BID Sanjuana Kava, NP   0.5 mg at 02/01/12 0806  . fluticasone (FLONASE) 50 MCG/ACT nasal spray 1 spray  1 spray Each Nare BID Shuvon Rankin, NP   1 spray at 02/01/12 0806  . haloperidol (HALDOL) tablet 0.5 mg  0.5 mg Oral BID Sanjuana Kava, NP   0.5 mg at 02/01/12 0806  . ibuprofen (ADVIL,MOTRIN) tablet 600 mg  600 mg Oral Q6H PRN Kerry Hough, PA   600 mg at 01/30/12 0703  . magnesium hydroxide (MILK OF MAGNESIA) suspension 30 mL  30 mL Oral Daily PRN Kerry Hough, PA      . nicotine (NICODERM CQ - dosed in mg/24 hours) patch 21 mg  21 mg Transdermal Q0600 Kerry Hough, PA      . ondansetron (ZOFRAN-ODT) disintegrating tablet 8 mg  8 mg Oral Q8H PRN Shuvon Rankin, NP   8 mg at 01/30/12 1820  . pseudoephedrine (SUDAFED) tablet 30 mg  30 mg Oral Q4H PRN Sanjuana Kava, NP   30 mg at 02/01/12 0809  . sertraline (ZOLOFT) tablet 50 mg  50 mg Oral Daily Lulu Hirschmann, MD   50 mg at 02/01/12 0806  . traZODone (DESYREL) tablet 100 mg  100 mg Oral QHS,MR X 1 Sanjuana Kava, NP  100 mg at 01/31/12 2258    Lab Results: No results found for this or any previous visit (from the past 48 hour(s)).  Physical Findings: AIMS: Facial and Oral Movements Muscles of Facial Expression: None, normal Lips and Perioral Area: None, normal Jaw: None, normal Tongue: None, normal,Extremity Movements Upper (arms, wrists, hands, fingers): None, normal Lower (legs, knees, ankles, toes): None, normal, Trunk Movements Neck, shoulders, hips: None, normal, Overall Severity Severity of abnormal movements (highest score from questions above): None, normal Incapacitation due to abnormal movements: None, normal Patient's awareness of abnormal movements (rate only patient's report): No Awareness, Dental Status Current problems with teeth and/or  dentures?: No Does patient usually wear dentures?: No  CIWA:  CIWA-Ar Total: 0  COWS:     Treatment Plan Summary: Daily contact with patient to assess and evaluate symptoms and progress in treatment Medication management  Plan: Increase trazodone to help with sleep. Continue current plan of care.  Medical Decision Making Problem Points:  Established problem, stable/improving (1), Review of last therapy session (1) and Review of psycho-social stressors (1) Data Points:  Review of medication regiment & side effects (2) Review of new medications or change in dosage (2)  I certify that inpatient services furnished can reasonably be expected to improve the patient's condition.   [name] 02/01/2012, 12:00 PM

## 2012-02-01 NOTE — Progress Notes (Signed)
[facility] LCSW Group Therapy       8:30-9:30 AM   02/01/2012 3:48 PM  Type of Therapy:  Group Therapy  Participation Level:  Active  Participation Quality:  Appropriate  Affect:  Appropriate  Cognitive:  Appropriate  Insight:  Engaged  Engagement in Therapy:  Engaged  Modes of Intervention:  Discussion, Education, Problem-solving and Support  Summary of Progress/Problems:  Patient shared his obstacle is stress.  He stated his number one stressor is inability to find employment and second is conflict with child's mother.  [name] 02/01/2012, 3:48 PM

## 2012-02-01 NOTE — Progress Notes (Signed)
Psychoeducational Group Note  Date:  02/01/2012 Time: 1100  Group Topic/Focus:  Self Care:   The focus of this group is to help patients understand the importance of self-care in order to improve or restore emotional, physical, spiritual, interpersonal, and financial health.  Participation Level:  Active  Participation Quality:  Appropriate, Attentive and Sharing  Affect:  Appropriate  Cognitive:  Appropriate  Insight:  Engaged  Engagement in Group:  Engaged  Additional Comments:  Lawrence Whitaker participated and shared during group. Patient defined self care in own terms. Patient then completed the self care assessment in the workbook. The workbook consisted of things on ways to improve or restore emotional, physical, psychological, spiritual, relationship areas of self care. Once patient completed assessment, patient was asked to share what areas needed improvement and what areas needed strengths. Areas that needed improvement patient was to set a goal on ways to improve self care.  [name] 02/01/2012, 1:52 PM

## 2012-02-01 NOTE — H&P (Signed)
Reviewed. Patient seen and assessed the same day, recommendations discussed prior to starting medication.

## 2012-02-01 NOTE — Progress Notes (Signed)
D: Pt stated in wrapup group that "day was pretty good", feels medication is helping tremendously with anger control.  Looking forward to going home, committed to staying on meds to set example for his two young sons.  Pt reported feelings of anger.  Facial expression animated with good eye contact and appropriate affect.  Mood is depressed and preoccupied.  Speech logical and coherent; no evidence of disordered thought process or content at this time.  Interaction is pleasant and assertive, Pt is proud of progress made.  Denies SI/HI, AVH, and acute pain.  Contracting for safety on unit.  A: Provided encouragement and positive reinforcement for Pt's optimistic attitude and supported his efforts to engage peers in conversations about positive coping skills.  Given both doses of Trazodone with repeat of 100mg  PO given @ 2258.  All medications administered according to med orders and POC.  Q15 minute safety checks maintained as per unit policy.  R: Pt polite, cooperative, and respectful.  Positive attitude about recovery following discharge.  Safety maintained. Dion Saucier RN

## 2012-02-01 NOTE — Progress Notes (Signed)
[facility] INPATIENT:  Family/Significant Other Suicide Prevention Education  Suicide Prevention Education:  Education Completed; Lawrence Whitaker, Mother, [phone] x 222 work number,  has been identified by the patient as the family member/significant other with whom the patient will be residing, and identified as the person(s) who will aid the patient in the event of a mental health crisis (suicidal ideations/suicide attempt).  With written consent from the patient, the family member/significant other has been provided the following suicide prevention education, prior to the and/or following the discharge of the patient.  The suicide prevention education provided includes the following:  Suicide risk factors  Suicide prevention and interventions  National Suicide Hotline telephone number  [facility] assessment telephone number  [facility] Emergency Assistance 911  [facility] and/or Residential Mobile Crisis Unit telephone number  Request made of family/significant other to:  Remove weapons (e.g., guns, rifles, knives), all items previously/currently identified as safety concern.  Mother reports there are no guns in the home.  Remove drugs/medications (over-the-counter, prescriptions, illicit drugs), all items previously/currently identified as a safety concern.  The family member/significant other verbalizes understanding of the suicide prevention education information provided.  The family member/significant other agrees to remove the items of safety concern listed above.  Lawrence Whitaker 02/01/2012, 11:17 AM

## 2012-02-01 NOTE — Progress Notes (Signed)
Patient ID: Lawrence Whitaker, male   DOB: Jul 30, 1980, 31 y.o.   MRN: [id number] D-Patient reports he slept well and that his appetite is good.  His energy level is normal and his ability to pay attention is good. A- supported patient.  R-He says medications are working well and he feels his mood is level with no anger.   He is rating his depression and hopelessness at 1/10.  He has been attending groups and he is interacting with peers and staff.

## 2012-02-01 NOTE — Tx Team (Signed)
Interdisciplinary Treatment Plan Update (Adult)  Date:  02/01/2012  Time Reviewed:  9:42 AM   Progress in Treatment: Attending groups:   Yes   Participating in groups:  Yes Taking medication as prescribed:  Yes Tolerating medication:  Yes Family/Significant othe contact made: Contact to be made with family Patient understands diagnosis:  Yes Discussing patient identified problems/goals with staff: Yes Medical problems stabilized or resolved: Yes Denies suicidal/homicidal ideation:Yes Issues/concerns per patient self-inventory:  Other:   New problem(s) identified:  Reason for Continuation of Hospitalization: Medication stabilization   Interventions implemented related to continuation of hospitalization:  Medication Management; safety checks q 15 mins  Additional comments:  Estimated length of stay:  2-3 days  Discharge Plan:  Home with outpatient follow up  New goal(s):  Review of initial/current patient goals per problem list:    1.  Goal(s): Eliminate SI/other thoughts of self harm   Met:  Yes  Target date: d/c  As evidenced by: Patient no longer endorsing SI/HI or other thoughts of self harm.    2.  Goal (s):Reduce depression/anxiety  Met: Yes  Target date: d/c  As evidenced by: Patient currently rating symptoms at four or below    3.  Goal(s):.stabilize on meds   Met:  Yes  Target date: d/c  As evidenced by: Patient reports being stabilized on medications - less symptomatic    4.  Goal(s): Refer for outpatient follow up   Met:  Yes  Target date: d/c  As evidenced by: Follow up appointment scheduled    Attendees: Patient:   02/01/2012 9:42 AM  Physican:  Patrick North, MD 02/01/2012 9:42 AM  Nursing:  Neill Loft, RN 02/01/2012 9:42 AM   Nursing:   Quintella Reichert, RN 02/01/2012 9:42 AM   Clinical Social Worker:  Juline Patch, LCSW 02/01/2012 9:42 AM   Other: Serena Colonel, FNP 02/01/2012 9:42 AM   Other:  Patton Salles, Clinical  Social Worker, LCSW  02/01/2012 9:42 AM Other:        02/01/2012 9:42 AM

## 2012-02-02 MED ORDER — SERTRALINE HCL 50 MG PO TABS: 100.0000 mg | ORAL_TABLET | Freq: Every day | ORAL | Status: DC

## 2012-02-02 MED ORDER — TRAZODONE HCL 300 MG PO TABS: 300.0000 mg | ORAL_TABLET | Freq: Every day | ORAL | Status: DC

## 2012-02-02 MED ORDER — PSEUDOEPHEDRINE HCL 30 MG PO TABS: 30.0000 mg | ORAL_TABLET | ORAL | Status: DC | PRN

## 2012-02-02 MED ORDER — TRAZODONE HCL 100 MG PO TABS
300.0000 mg | ORAL_TABLET | Freq: Every day | ORAL | Status: DC
  Filled 2012-02-02: qty 42

## 2012-02-02 MED ORDER — HALOPERIDOL 0.5 MG PO TABS: 0.5000 mg | ORAL_TABLET | Freq: Two times a day (BID) | ORAL | Status: DC

## 2012-02-02 MED ORDER — TRAZODONE HCL 300 MG PO TABS: 300.0000 mg | ORAL_TABLET | Freq: Every evening | ORAL | Status: DC | PRN

## 2012-02-02 MED ORDER — BENZTROPINE MESYLATE 0.5 MG PO TABS: 0.5000 mg | ORAL_TABLET | Freq: Two times a day (BID) | ORAL | Status: DC

## 2012-02-02 NOTE — Discharge Summary (Signed)
Physician Discharge Summary Note  Patient:  Lawrence Whitaker is an 31 y.o., male MRN:  [id number] DOB:  1980/07/11 Patient phone:  [phone] (home)  Patient address:   [address] [place] [address],   Date of Admission:  01/27/2012 Date of Discharge: 02/01/2012  Reason for Admission:  Depression and suicidal/homicidal ideations  Discharge Diagnoses: Principal Problem:  *Major depressive disorder, recurrent episode, severe, with psychotic behavior  Review of Systems  Constitutional: Negative.   HENT: Negative.   Eyes: Negative.   Respiratory: Negative.   Cardiovascular: Negative.   Gastrointestinal: Negative.   Genitourinary: Negative.   Musculoskeletal: Negative.   Skin: Negative.   Neurological: Negative.   Endo/Heme/Allergies: Negative.   Psychiatric/Behavioral: Negative.    Axis Diagnosis:   AXIS I:  Depressive Disorder NOS AXIS II:  Deferred AXIS III:   Past Medical History  Diagnosis Date  . Depression    AXIS IV:  economic problems, occupational problems, other psychosocial or environmental problems, problems related to social environment and problems with primary support group AXIS V:  61-70 mild symptoms  Level of Care:  OP  Hospital Course:  Individual and group therapy, coping skills developed for anger management, medications ordered for his hallucinations--haldol 0.5 mg BID with cogentin to prevent EPS, sudafed PRN for cold/congestion, Zoloft for depression, and trazodone for sleep.  [name] denied depression/suicidal/homicidal ideations, stated his hallucinations went away when the haldol was started.  Patient is stable and ready for discharge.  Consults:  None  Significant Diagnostic Studies:  labs: Completed and reviewed, stable  Discharge Vitals:   Blood pressure 170/94, pulse 100, temperature 97 F (36.1 C), temperature source Oral, resp. rate 18, height 6' 0.5" (1.842 m), weight 128.368 kg (283 lb). Body mass index is 37.85  kg/(m^2). Lab Results:   No results found for this or any previous visit (from the past 72 hour(s)).  Physical Findings: AIMS: Facial and Oral Movements Muscles of Facial Expression: None, normal Lips and Perioral Area: None, normal Jaw: None, normal Tongue: None, normal,Extremity Movements Upper (arms, wrists, hands, fingers): None, normal Lower (legs, knees, ankles, toes): None, normal, Trunk Movements Neck, shoulders, hips: None, normal, Overall Severity Severity of abnormal movements (highest score from questions above): None, normal Incapacitation due to abnormal movements: None, normal Patient's awareness of abnormal movements (rate only patient's report): No Awareness, Dental Status Current problems with teeth and/or dentures?: No Does patient usually wear dentures?: No  CIWA:  CIWA-Ar Total: 0  COWS:     Psychiatric Specialty Exam: See Psychiatric Specialty Exam and Suicide Risk Assessment completed by Attending Physician prior to discharge.  Discharge destination:  Home  Is patient on multiple antipsychotic therapies at discharge:  No   Has Patient had three or more failed trials of antipsychotic monotherapy by history:  No Recommended Plan for Multiple Antipsychotic Therapies:  N/A  Discharge Orders    Future Orders Please Complete By Expires   Diet - low sodium heart healthy      Activity as tolerated - No restrictions          Medication List     As of 02/02/2012 10:27 AM    TAKE these medications      Indication    benztropine 0.5 MG tablet   Commonly known as: COGENTIN   Take 1 tablet (0.5 mg total) by mouth 2 (two) times daily.    Indication: Extrapyramidal Reaction caused by Medications      haloperidol 0.5 MG tablet   Commonly known as: HALDOL  Take 1 tablet (0.5 mg total) by mouth 2 (two) times daily.    Indication: Psychosis      ibuprofen 200 MG tablet   Commonly known as: ADVIL,MOTRIN   Take 400 mg by mouth every 6 (six) hours as needed.  For pain in the hips       pseudoephedrine 30 MG tablet   Commonly known as: SUDAFED   Take 1 tablet (30 mg total) by mouth every 4 (four) hours as needed for congestion.       sertraline 50 MG tablet   Commonly known as: ZOLOFT   Take 2 tablets (100 mg total) by mouth daily.    Indication: Major Depressive Disorder      trazodone 300 MG tablet   Commonly known as: DESYREL   Take 1 tablet (300 mg total) by mouth at bedtime and may repeat dose one time if needed.    Indication: Trouble Sleeping           Follow-up Information    Follow up with [facility]. On 02/03/2012. (You are scheduled with [facility] on Wednesday, February 03, 2012 at [facility].  Referral number [phone].)    Contact information:   [address] [place], [place]  [address] [phone]           Follow-up recommendations:  Activity as tolerated, low-sodium heart healthy diet  Comments:  Lawrence Whitaker will follow-up with [facility] after discharge.  Total Discharge Time:  Greater than 30 minutes  Signed: Nanine Means, PMH-NP 02/02/2012, 10:27 AM

## 2012-02-02 NOTE — Progress Notes (Signed)
[facility] Adult Case Management Discharge Plan :  Will you be returning to the same living situation after discharge: Yes,  Patient returning to his mother's home At discharge, do you have transportation home?:Yes,  Mother transported patient home Do you have the ability to pay for your medications:No. Patient assisted with indigent medications  Release of information consent forms completed and in the chart;  Patient's signature needed at discharge.  Patient to Follow up at: Follow-up Information    Follow up with [facility]. On 02/03/2012. (You are scheduled with [facility] on Wednesday, February 03, 2012 at [facility].  Referral number [phone].)    Contact information:   [address] [place], [place]  [address] [phone]           Patient denies SI/HI:   Yes,  Patient no longer endorsings SI/HI or any thoughts of self harm    Safety Planning and Suicide Prevention discussed:  Yes,  Reviewed during after care groups  Georgianna Band, [name] 02/02/2012, 3:55 PM

## 2012-02-02 NOTE — Discharge Summary (Signed)
Reviewed

## 2012-02-02 NOTE — [facility] Suicide Risk Assessment (Signed)
Suicide Risk Assessment  Discharge Assessment     Demographic Factors:  Male, Low socioeconomic status and Unemployed, african american  Mental Status Per Nursing Assessment::   On Admission:  Self-harm thoughts (passive at times- denies presently)  Current Mental Status by Physician: Patient alert and oriented to 4. Denies AH/Vh/SI/HI.  Loss Factors: Decrease in vocational status and Financial problems/change in socioeconomic status  Historical Factors: Impulsivity  Risk Reduction Factors:   Sense of responsibility to family and Positive social support  Continued Clinical Symptoms:  Previous Psychiatric Diagnoses and Treatments  Cognitive Features That Contribute To Risk:  Cognitively intact  Suicide Risk:  Minimal: No identifiable suicidal ideation.  Patients presenting with no risk factors but with morbid ruminations; may be classified as minimal risk based on the severity of the depressive symptoms  Discharge Diagnoses:   AXIS I:  Anxiety Disorder NOS and Depressive Disorder NOS AXIS II:  No diagnosis AXIS III:   Past Medical History  Diagnosis Date  . Depression    AXIS IV:  economic problems and occupational problems AXIS V:  61-70 mild symptoms  Plan Of Care/Follow-up recommendations:  Activity:  normal Diet:  normal  Is patient on multiple antipsychotic therapies at discharge:  No   Has Patient had three or more failed trials of antipsychotic monotherapy by history:  No  Recommended Plan for Multiple Antipsychotic Therapies: Na  Lawrence Whitaker 02/02/2012, 10:13 AM

## 2012-02-02 NOTE — Progress Notes (Signed)
Currently resting quietly in bed with eyes closed. Respirations even and unlabored. No acute distress noted. Safety has been maintained with Q15 minute observation. Will continue current POC 

## 2012-02-05 NOTE — Progress Notes (Signed)
Patient Discharge Instructions:  After Visit Summary (AVS):   Faxed to:  02/05/12 Psychiatric Admission Assessment Note:   Faxed to:  02/05/12 Suicide Risk Assessment - Discharge Assessment:   Faxed to:  02/05/12 Faxed/Sent to the Next Level Care provider:  02/05/12 Faxed to [facility] @ [phone]  Jerelene Redden, 02/05/2012, 3:19 PM

## 2014-01-23 ENCOUNTER — Encounter (HOSPITAL_COMMUNITY): Payer: Self-pay | Admitting: Emergency Medicine

## 2014-01-23 ENCOUNTER — Emergency Department (HOSPITAL_COMMUNITY): Payer: Self-pay

## 2014-01-23 ENCOUNTER — Emergency Department (HOSPITAL_COMMUNITY)
Admission: EM | Admit: 2014-01-23 | Discharge: 2014-01-23 | Disposition: A | Discharge status: Home / Self Care | Payer: Self-pay | Attending: Emergency Medicine | Admitting: Emergency Medicine

## 2014-01-23 DIAGNOSIS — Z79899 Other long term (current) drug therapy: Secondary | ICD-10-CM | POA: Insufficient documentation

## 2014-01-23 DIAGNOSIS — S7001XA Contusion of right hip, initial encounter: Secondary | ICD-10-CM

## 2014-01-23 DIAGNOSIS — S335XXA Sprain of ligaments of lumbar spine, initial encounter: Secondary | ICD-10-CM | POA: Insufficient documentation

## 2014-01-23 DIAGNOSIS — W108XXA Fall (on) (from) other stairs and steps, initial encounter: Secondary | ICD-10-CM | POA: Insufficient documentation

## 2014-01-23 DIAGNOSIS — Y929 Unspecified place or not applicable: Secondary | ICD-10-CM | POA: Insufficient documentation

## 2014-01-23 DIAGNOSIS — Y9389 Activity, other specified: Secondary | ICD-10-CM | POA: Insufficient documentation

## 2014-01-23 DIAGNOSIS — Y998 Other external cause status: Secondary | ICD-10-CM | POA: Insufficient documentation

## 2014-01-23 DIAGNOSIS — S7002XA Contusion of left hip, initial encounter: Secondary | ICD-10-CM | POA: Insufficient documentation

## 2014-01-23 DIAGNOSIS — F329 Major depressive disorder, single episode, unspecified: Secondary | ICD-10-CM | POA: Insufficient documentation

## 2014-01-23 DIAGNOSIS — W19XXXA Unspecified fall, initial encounter: Secondary | ICD-10-CM

## 2014-01-23 MED ORDER — IBUPROFEN 800 MG PO TABS
800.0000 mg | ORAL_TABLET | Freq: Once | ORAL | Status: AC
  Administered 2014-01-23: 800 mg via ORAL
  Filled 2014-01-23: qty 1

## 2014-01-23 MED ORDER — METHOCARBAMOL 500 MG PO TABS
500.0000 mg | ORAL_TABLET | Freq: Once | ORAL | Status: AC
  Administered 2014-01-23: 500 mg via ORAL
  Filled 2014-01-23: qty 1

## 2014-01-23 MED ORDER — HYDROCODONE-ACETAMINOPHEN 5-325 MG PO TABS
ORAL_TABLET | ORAL | Status: DC
Start: 2014-01-23 — End: 2015-11-26

## 2014-01-23 MED ORDER — IBUPROFEN 800 MG PO TABS: 800.0000 mg | ORAL_TABLET | Freq: Three times a day (TID) | ORAL | Status: DC

## 2014-01-23 MED ORDER — CYCLOBENZAPRINE HCL 10 MG PO TABS: 10.0000 mg | ORAL_TABLET | Freq: Three times a day (TID) | ORAL | Status: DC | PRN

## 2014-01-23 NOTE — ED Notes (Signed)
Pt also states that he is out of his bipolar medication for last  couple months due to decrease in sexual issues.

## 2014-01-23 NOTE — ED Provider Notes (Signed)
CSN: [id number]     Arrival date & time 01/23/14  [id number] History   First MD Initiated Contact with Patient 01/23/14 [phone]     No chief complaint on file.    (Consider location/radiation/quality/duration/timing/severity/associated sxs/prior Treatment) HPI  Lawrence Whitaker is a 33 y.o. 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  He states that he slipped on icy steps and fell down approximately 8 steps, landing on his right side.  He was able to get up unassisted after the fall, but reports increasing back and hip pain.  He ahs nto taken any medications or tried any therapies prior to ED arrival.  He states that he went to work briefly before coming here.  He denies head injury, LOC, visual changes, chest or abdominal pain, dizziness, incontinence of bladder or bowel, numbness or weakness of the LE's.   Past Medical History  Diagnosis Date  . Depression    Past Surgical History  Procedure Laterality Date  . Hip surgery     No family history on file. History  Substance Use Topics  . Smoking status: Current Every Day Smoker -- 1.00 packs/day for 15 years    Types: Cigarettes  . Smokeless tobacco: Not on file  . Alcohol Use: Yes     Comment: occasionally    Review of Systems  Constitutional: Negative for fever and chills.  Gastrointestinal: Negative for nausea, vomiting and abdominal pain.  Genitourinary: Negative for dysuria, flank pain and difficulty urinating.  Musculoskeletal: Positive for back pain, arthralgias and neck pain. Negative for joint swelling and gait problem.       Right hip tenderness  Skin: Negative for color change and wound.  Neurological: Negative for dizziness, syncope, weakness, numbness and headaches.  Psychiatric/Behavioral: Negative for confusion.  All other systems reviewed and are negative.     Allergies   Review of patient's allergies indicates no known allergies.  Home Medications   Prior to Admission medications   Medication Sig Start Date End Date Taking? Authorizing Provider  benztropine (COGENTIN) 0.5 MG tablet Take 1 tablet (0.5 mg total) by mouth 2 (two) times daily. 02/02/12   Nanine MeansJamison Lord, NP  haloperidol (HALDOL) 0.5 MG tablet Take 1 tablet (0.5 mg total) by mouth 2 (two) times daily. 02/02/12   Nanine MeansJamison Lord, NP  ibuprofen (ADVIL,MOTRIN) 200 MG tablet Take 400 mg by mouth every 6 (six) hours as needed. For pain in the hips    Historical Provider, MD  sertraline (ZOLOFT) 50 MG tablet Take 2 tablets (100 mg total) by mouth daily. 02/02/12   Nanine MeansJamison Lord, NP  trazodone (DESYREL) 300 MG tablet Take 1 tablet (300 mg total) by mouth at bedtime. 02/02/12   Nanine MeansJamison Lord, NP   There were no vitals taken for this visit. Physical Exam  Constitutional: He is oriented to person, place, and time. He appears well-developed and well-nourished. No distress.  HENT:  Head: Normocephalic and atraumatic.  Neck: Normal range of motion. Neck supple.  mild tenderness to palp of the left cervical paraspinal muscles.  No spinal tenderness or step-offs  Cardiovascular: Normal rate, regular rhythm, normal heart sounds and intact distal pulses.   No murmur heard. Pulmonary/Chest: Effort normal and breath sounds normal. No respiratory distress.  Abdominal: Soft. He exhibits no distension. There is no tenderness. There is no rebound and no guarding.  Musculoskeletal: He exhibits tenderness.  He exhibits no edema.       Lumbar back: He exhibits tenderness and pain. He exhibits normal range of motion, no swelling, no deformity, no laceration and normal pulse.  ttp of the lumbar spine and bilateral paraspinal muscles.   DP pulses are brisk and symmetrical.  Distal sensation intact.  Hip Flexors/Extensors are intact.  Pt has 5/5 strength against resistance of bilateral lower extremities.  Pt alos has ttp of the  lateral right hip.  No bony deformity   Neurological: He is alert and oriented to person, place, and time. He has normal strength. No sensory deficit. He exhibits normal muscle tone. Coordination and gait normal.  Reflex Scores:      Patellar reflexes are 2+ on the right side and 2+ on the left side.      Achilles reflexes are 2+ on the right side and 2+ on the left side. Skin: Skin is warm and dry. No rash noted.  Nursing note and vitals reviewed.   ED Course  Procedures (including critical care time) Labs Review Labs Reviewed - No data to display  Imaging Review Dg Lumbar Spine Complete  01/23/2014   CLINICAL DATA:  Back and hip pain after falling down steps at home.  EXAM: LUMBAR SPINE - COMPLETE 4+ VIEW  COMPARISON:  None.  FINDINGS: There is no fracture or subluxation or disc space narrowing. 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  IMPRESSION: No acute abnormality.  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   Electronically Signed   By: Geanie CooleyJim  Maxwell M.D.   On: 01/23/2014 09:56   Dg Hip Complete Right  01/23/2014   CLINICAL DATA:  33 year old male who fell at home slipped on IAC steps. Pain radiating from the right hip to the back. Personal history of prior hip surgery for slipped epiphysis. Initial encounter.  EXAM: RIGHT HIP - COMPLETE 2+ VIEW  COMPARISON:  None.  FINDINGS: Bilateral cannulated screws traversing the proximal femurs. Chronic deformity of the right femoral head and neck with associated acetabular irregularity and subchondral sclerosis. Pelvis intact. sacral ala and SI joints within normal limits. Proximal right femur and right femoral hardware appear intact. No acute fracture or dislocation identified.  IMPRESSION: No acute fracture or dislocation identified about the right hip or pelvis. Postoperative changes to both proximal femurs, evidence of previous right SCFE, and osteoarthritis.   Electronically Signed   By: Augusto GambleLee  Hall M.D.   On: 01/23/2014 09:58     EKG  Interpretation None      MDM   Final diagnoses:  Fall  Lumbar sprain, initial encounter  Contusion, hip, right, initial encounter    Patient is ambulatory with a steady gait.  No focal neuro deficits.  No concerning sx's for emergetn neurological process.  Likely musculoskeletal.  Pt agrees to symptomatic treatment with flexeril, vicodin and ibuprofen, close f/u with PMD.      Lawrence Kumpf L. Trisha Mangleriplett, Lawrence Whitaker 01/25/14 1438  Lawrence Munchobert Lockwood, MD 01/27/14 [phone]

## 2014-01-23 NOTE — ED Notes (Signed)
Patient with no complaints at this time. Respirations even and unlabored. Skin warm/dry. Discharge instructions reviewed with patient at this time. Patient given opportunity to voice concerns/ask questions. Patient discharged at this time and left Emergency Department with steady gait.   

## 2014-01-23 NOTE — ED Notes (Signed)
Fell at home on porch down 8 steps.  Hurt right hip and back rating both 8 and injury to neck rates pain 5.

## 2014-01-23 NOTE — ED Notes (Signed)
Pt in xray

## 2014-01-23 NOTE — Discharge Instructions (Signed)
Contusion A contusion is a deep bruise. Contusions happen when an injury causes bleeding under the skin. Signs of bruising include pain, puffiness (swelling), and discolored skin. The contusion may turn blue, purple, or yellow. HOME CARE   Put ice on the injured area.  Put ice in a plastic bag.  Place a towel between your skin and the bag.  Leave the ice on for 15-20 minutes, 03-04 times a day.  Only take medicine as told by your doctor.  Rest the injured area.  If possible, raise (elevate) the injured area to lessen puffiness. GET HELP RIGHT AWAY IF:   You have more bruising or puffiness.  You have pain that is getting worse.  Your puffiness or pain is not helped by medicine. MAKE SURE YOU:   Understand these instructions.  Will watch your condition.  Will get help right away if you are not doing well or get worse. Document Released: 07/22/2007 Document Revised: 04/27/2011 Document Reviewed: 12/08/2010 [facility] Patient Information 2015 [place], [place]. This information is not intended to replace advice given to you by your health care provider. Make sure you discuss any questions you have with your health care provider.  Lumbosacral Strain Lumbosacral strain is a strain of any of the parts that make up your lumbosacral vertebrae. Your lumbosacral vertebrae are the bones that make up the lower third of your backbone. Your lumbosacral vertebrae are held together by muscles and tough, fibrous tissue (ligaments).  CAUSES  A sudden blow to your back can cause lumbosacral strain. Also, anything that causes an excessive stretch of the muscles in the low back can cause this strain. This is typically seen when people exert themselves strenuously, fall, lift heavy objects, bend, or crouch repeatedly. RISK FACTORS  Physically demanding work.  Participation in pushing or pulling sports or sports that require a sudden twist of the back (tennis, golf, baseball).  Weight  lifting.  Excessive lower back curvature.  Forward-tilted pelvis.  Weak back or abdominal muscles or both.  Tight hamstrings. SIGNS AND SYMPTOMS  Lumbosacral strain may cause pain in the area of your injury or pain that moves (radiates) down your leg.  DIAGNOSIS Your health care provider can often diagnose lumbosacral strain through a physical exam. In some cases, you may need tests such as X-ray exams.  TREATMENT  Treatment for your lower back injury depends on many factors that your clinician will have to evaluate. However, most treatment will include the use of anti-inflammatory medicines. HOME CARE INSTRUCTIONS   Avoid hard physical activities (tennis, racquetball, waterskiing) if you are not in proper physical condition for it. This may aggravate or create problems.  If you have a back problem, avoid sports requiring sudden body movements. Swimming and walking are generally safer activities.  Maintain good posture.  Maintain a healthy weight.  For acute conditions, you may put ice on the injured area.  Put ice in a plastic bag.  Place a towel between your skin and the bag.  Leave the ice on for 20 minutes, 2-3 times a day.  When the low back starts healing, stretching and strengthening exercises may be recommended. SEEK MEDICAL CARE IF:  Your back pain is getting worse.  You experience severe back pain not relieved with medicines. SEEK IMMEDIATE MEDICAL CARE IF:   You have numbness, tingling, weakness, or problems with the use of your arms or legs.  There is a change in bowel or bladder control.  You have increasing pain in any area of the  body, including your belly (abdomen).  You notice shortness of breath, dizziness, or feel faint.  You feel sick to your stomach (nauseous), are throwing up (vomiting), or become sweaty.  You notice discoloration of your toes or legs, or your feet get very cold. MAKE SURE YOU:   Understand these instructions.  Will watch  your condition.  Will get help right away if you are not doing well or get worse. Document Released: 11/12/2004 Document Revised: 02/07/2013 Document Reviewed: 09/21/2012 [facility]Exit[facility] Patient Information 2015 [place], [place]. This information is not intended to replace advice given to you by your health care provider. Make sure you discuss any questions you have with your health care provider.

## 2014-02-12 ENCOUNTER — Encounter (HOSPITAL_COMMUNITY): Payer: Self-pay | Admitting: Emergency Medicine

## 2014-02-12 ENCOUNTER — Emergency Department (HOSPITAL_COMMUNITY)
Admission: EM | Admit: 2014-02-12 | Discharge: 2014-02-12 | Disposition: A | Discharge status: Home / Self Care | Payer: Self-pay | Attending: Emergency Medicine | Admitting: Emergency Medicine

## 2014-02-12 DIAGNOSIS — Y998 Other external cause status: Secondary | ICD-10-CM | POA: Insufficient documentation

## 2014-02-12 DIAGNOSIS — Y288XXA Contact with other sharp object, undetermined intent, initial encounter: Secondary | ICD-10-CM | POA: Insufficient documentation

## 2014-02-12 DIAGNOSIS — Y9289 Other specified places as the place of occurrence of the external cause: Secondary | ICD-10-CM | POA: Insufficient documentation

## 2014-02-12 DIAGNOSIS — Z23 Encounter for immunization: Secondary | ICD-10-CM | POA: Insufficient documentation

## 2014-02-12 DIAGNOSIS — Z720 Tobacco use: Secondary | ICD-10-CM | POA: Insufficient documentation

## 2014-02-12 DIAGNOSIS — S61011A Laceration without foreign body of right thumb without damage to nail, initial encounter: Secondary | ICD-10-CM | POA: Insufficient documentation

## 2014-02-12 DIAGNOSIS — Z79899 Other long term (current) drug therapy: Secondary | ICD-10-CM | POA: Insufficient documentation

## 2014-02-12 DIAGNOSIS — F319 Bipolar disorder, unspecified: Secondary | ICD-10-CM | POA: Insufficient documentation

## 2014-02-12 DIAGNOSIS — Y9389 Activity, other specified: Secondary | ICD-10-CM | POA: Insufficient documentation

## 2014-02-12 HISTORY — DX: Bipolar disorder, unspecified: F31.9

## 2014-02-12 MED ORDER — TETANUS-DIPHTH-ACELL PERTUSSIS 5-2.5-18.5 LF-MCG/0.5 IM SUSP
0.5000 mL | Freq: Once | INTRAMUSCULAR | Status: AC
  Administered 2014-02-12: 0.5 mL via INTRAMUSCULAR
  Filled 2014-02-12: qty 0.5

## 2014-02-12 MED ORDER — LIDOCAINE HCL (PF) 1 % IJ SOLN
5.0000 mL | Freq: Once | INTRAMUSCULAR | Status: AC
  Administered 2014-02-12: 5 mL
  Filled 2014-02-12: qty 5

## 2014-02-12 NOTE — Discharge Instructions (Signed)

## 2014-02-12 NOTE — ED Notes (Signed)
Pt reports was working on a laptop and cut right hand on a piece of metal. No active bleeding noted. Medium laceration noted distal to right thumb. nad noted.

## 2014-02-12 NOTE — ED Provider Notes (Signed)
CSN: [id number]     Arrival date & time 02/12/14  1401 History  This chart was scribed for non-physician practitioner, Dierdre ForthHannah Deysy Schabel, PA-C working with Layla MawKristen N Ward, DO by Luisa DagoPriscilla Tutu, ED scribe. This patient was seen in room APFT22/APFT22 and the patient's care was started at 4:05PM.    Chief Complaint  Patient presents with  . Laceration      The history is provided by the patient and medical records. No language interpreter was used.   HPI Comments: Lawrence CanterburyJoseph Whitaker is a 33 y.o. male with PMhx of depression and bipolar 1 disorder, presents to the Emergency Department complaining of a laceration to the base of his right thumb that occurred approximately 1 hour ago. Pt states that he cut his right thumb working on a laptop. Pt does not recall his last tetanus vaccine date. Bleeding is controlled. Denies numbness, fever, chills, weakness, nausea, or emesis.  Past Medical History  Diagnosis Date  . Depression   . Bipolar 1 disorder    Past Surgical History  Procedure Laterality Date  . Hip surgery     History reviewed. No pertinent family history. History  Substance Use Topics  . Smoking status: Current Every Day Smoker -- 1.00 packs/day for 15 years    Types: Cigarettes  . Smokeless tobacco: Not on file  . Alcohol Use: Yes     Comment: occasionally    Review of Systems  Constitutional: Negative for fever and chills.  Gastrointestinal: Negative for nausea and vomiting.  Skin: Positive for wound.  Allergic/Immunologic: Negative for immunocompromised state.  Neurological: Negative for weakness and numbness.  Hematological: Does not bruise/bleed easily.  Psychiatric/Behavioral: The patient is not nervous/anxious.       Allergies  Review of patient's allergies indicates no known allergies.  Home Medications   Prior to Admission medications   Medication Sig Start Date End Date Taking? Authorizing Provider  benztropine (COGENTIN) 0.5 MG tablet Take 1 tablet  (0.5 mg total) by mouth 2 (two) times daily. Patient not taking: Reported on 01/23/2014 02/02/12   Nanine MeansJamison Lord, NP  cyclobenzaprine (FLEXERIL) 10 MG tablet Take 1 tablet (10 mg total) by mouth 3 (three) times daily as needed. 01/23/14   Tammy L. Triplett, PA-C  haloperidol (HALDOL) 0.5 MG tablet Take 1 tablet (0.5 mg total) by mouth 2 (two) times daily. Patient not taking: Reported on 01/23/2014 02/02/12   Nanine MeansJamison Lord, NP  HYDROcodone-acetaminophen (NORCO/VICODIN) 5-325 MG per tablet Take one-two tabs po q 4-6 hrs prn pain 01/23/14   Tammy L. Triplett, PA-C  ibuprofen (ADVIL,MOTRIN) 800 MG tablet Take 1 tablet (800 mg total) by mouth 3 (three) times daily. 01/23/14   Tammy L. Triplett, PA-C  sertraline (ZOLOFT) 50 MG tablet Take 2 tablets (100 mg total) by mouth daily. Patient not taking: Reported on 01/23/2014 02/02/12   Nanine MeansJamison Lord, NP  trazodone (DESYREL) 300 MG tablet Take 1 tablet (300 mg total) by mouth at bedtime. Patient not taking: Reported on 01/23/2014 02/02/12   Nanine MeansJamison Lord, NP   Triage vitals:BP 129/69 mmHg  Pulse 58  Temp(Src) 98.2 F (36.8 C) (Oral)  Resp 16  Ht 6\' 3"  (1.905 m)  Wt 285 lb (129.275 kg)  BMI 35.62 kg/m2  SpO2 100%  Physical Exam  Constitutional: He is oriented to person, place, and time. He appears well-developed and well-nourished. No distress.  HENT:  Head: Normocephalic and atraumatic.  Eyes: Conjunctivae are normal. No scleral icterus.  Neck: Normal range of motion.  Cardiovascular: Normal rate, regular  rhythm, normal heart sounds and intact distal pulses.   No murmur heard. Capillary refill < 3 sec  Pulmonary/Chest: Effort normal and breath sounds normal. No respiratory distress.  Musculoskeletal: Normal range of motion. He exhibits no edema.  ROM: Full range of motion of all fingers of the right hand including right thumb  Neurological: He is alert and oriented to person, place, and time.  Sensation: Intact to dull and sharp in all fingers of the  right hand including right thumb Strength: 5/5 in the right hand including resisted flexion and extension of the right thumb  Skin: Skin is warm and dry. He is not diaphoretic.  2.5 cm laceration to the lateral portion of the right thumb  Psychiatric: He has a normal mood and affect.  Nursing note and vitals reviewed.   ED Course  LACERATION REPAIR Date/Time: 02/12/2014 4:21 PM Performed by: Dierdre ForthMUTHERSBAUGH, Menna Abeln Authorized by: Dierdre ForthMUTHERSBAUGH, Biviana Saddler Consent: Verbal consent obtained. Risks and benefits: risks, benefits and alternatives were discussed Consent given by: patient Patient understanding: patient states understanding of the procedure being performed Patient consent: the patient's understanding of the procedure matches consent given Procedure consent: procedure consent matches procedure scheduled Relevant documents: relevant documents present and verified Site marked: the operative site was marked Required items: required blood products, implants, devices, and special equipment available Patient identity confirmed: verbally with patient Time out: Immediately prior to procedure a "time out" was called to verify the correct patient, procedure, equipment, support staff and site/side marked as required. Body area: upper extremity Location details: right thumb Laceration length: 2.5 cm Foreign bodies: no foreign bodies Tendon involvement: none Nerve involvement: none Vascular damage: no Anesthesia: local infiltration Local anesthetic: lidocaine 1% without epinephrine Anesthetic total: 2 ml Patient sedated: no Preparation: Patient was prepped and draped in the usual sterile fashion. Irrigation solution: saline Irrigation method: syringe Amount of cleaning: standard Debridement: none Degree of undermining: none Skin closure: 5-0 Prolene Number of sutures: 2 Technique: simple Approximation: loose Approximation difficulty: simple Dressing: 4x4 sterile gauze Patient  tolerance: Patient tolerated the procedure well with no immediate complications   (including critical care time)  DIAGNOSTIC STUDIES: Oxygen Saturation is 100% on RA, normal by my interpretation.    COORDINATION OF CARE: 4:10 PM- Will perform a laceration repair. Pt advised of plan for treatment and pt agrees.     Labs Review Labs Reviewed - No data to display  Imaging Review No results found.   EKG Interpretation None      MDM   Final diagnoses:  Thumb laceration, right, initial encounter   Lawrence CanterburyJoseph Whitaker presents with laceration to the right thumb.  Tdap booster given.Pressure irrigation performed. Laceration occurred < 8 hours prior to repair which was well tolerated. Pt has no co morbidities to effect normal wound healing. Discussed suture home care w pt and answered questions. Pt to f-u for wound check and suture removal in 7 days. Pt is hemodynamically stable w no complaints prior to dc.    I have personally reviewed patient's vitals, nursing note and any pertinent labs or imaging.  I performed an focused physical exam; undressed when appropriate .    It has been determined that no acute conditions requiring further emergency intervention are present at this time. The patient/guardian have been advised of the diagnosis and plan. I reviewed any labs and imaging including any potential incidental findings. We have discussed signs and symptoms that warrant return to the ED and they are listed in the discharge instructions.  Vital signs are stable at discharge.   BP 129/69 mmHg  Pulse 58  Temp(Src) 98.2 F (36.8 C) (Oral)  Resp 16  Ht 6\' 3"  (1.905 m)  Wt 285 lb (129.275 kg)  BMI 35.62 kg/m2  SpO2 100%       Dierdre Forth, PA-C 02/12/14 1623  Kristen N Ward, DO 02/12/14 1623

## 2014-10-16 ENCOUNTER — Encounter (HOSPITAL_COMMUNITY): Payer: Self-pay | Admitting: Emergency Medicine

## 2014-10-16 ENCOUNTER — Emergency Department (HOSPITAL_COMMUNITY)
Admission: EM | Admit: 2014-10-16 | Discharge: 2014-10-16 | Disposition: A | Discharge status: Home / Self Care | Payer: BLUE CROSS/BLUE SHIELD | Attending: Emergency Medicine | Admitting: Emergency Medicine

## 2014-10-16 DIAGNOSIS — N342 Other urethritis: Secondary | ICD-10-CM

## 2014-10-16 DIAGNOSIS — R1030 Lower abdominal pain, unspecified: Secondary | ICD-10-CM | POA: Diagnosis present

## 2014-10-16 DIAGNOSIS — F319 Bipolar disorder, unspecified: Secondary | ICD-10-CM | POA: Insufficient documentation

## 2014-10-16 DIAGNOSIS — Z720 Tobacco use: Secondary | ICD-10-CM | POA: Diagnosis not present

## 2014-10-16 LAB — URINALYSIS, ROUTINE W REFLEX MICROSCOPIC
BILIRUBIN URINE: NEGATIVE
GLUCOSE, UA: NEGATIVE mg/dL
HGB URINE DIPSTICK: NEGATIVE
KETONES UR: NEGATIVE mg/dL
Leukocytes, UA: NEGATIVE
Nitrite: NEGATIVE
PH: 5.5 (ref 5.0–8.0)
Protein, ur: NEGATIVE mg/dL
Urobilinogen, UA: 0.2 mg/dL (ref 0.0–1.0)

## 2014-10-16 MED ORDER — AZITHROMYCIN 250 MG PO TABS
1000.0000 mg | ORAL_TABLET | Freq: Once | ORAL | Status: AC
  Administered 2014-10-16: 1000 mg via ORAL
  Filled 2014-10-16: qty 4

## 2014-10-16 MED ORDER — CEFTRIAXONE SODIUM 250 MG IJ SOLR
250.0000 mg | Freq: Once | INTRAMUSCULAR | Status: AC
  Administered 2014-10-16: 250 mg via INTRAMUSCULAR
  Filled 2014-10-16: qty 250

## 2014-10-16 MED ORDER — LIDOCAINE HCL (PF) 1 % IJ SOLN
INTRAMUSCULAR | Status: AC
  Administered 2014-10-16: 0.9 mL
  Filled 2014-10-16: qty 5

## 2014-10-16 NOTE — ED Provider Notes (Signed)
CSN: [id number]     Arrival date & time 10/16/14  0744 History   First MD Initiated Contact with Patient 10/16/14 [phone]     Chief Complaint  Patient presents with  . Groin Pain     (Consider location/radiation/quality/duration/timing/severity/associated sxs/prior Treatment) The history is provided by the patient.   Lawrence Whitaker is a 34 y.o. male presenting with a 2 day history of painful urination. He denies urinary frequency, fevers or chills, abdominal, flank or low back pain, nausea or vomiting.  He developed penile discharge yesterday. He endorses having unprotected sex with a new partner recently, to his knowledge she is not having symptoms.      Past Medical History  Diagnosis Date  . Depression   . Bipolar 1 disorder    Past Surgical History  Procedure Laterality Date  . Hip surgery     History reviewed. No pertinent family history. Social History  Substance Use Topics  . Smoking status: Current Every Day Smoker -- 1.00 packs/day for 15 years    Types: Cigarettes  . Smokeless tobacco: None  . Alcohol Use: Yes     Comment: occasionally    Review of Systems  Constitutional: Negative for fever.  HENT: Negative for congestion and sore throat.   Eyes: Negative.   Respiratory: Negative for chest tightness and shortness of breath.   Cardiovascular: Negative for chest pain.  Gastrointestinal: Negative for nausea and abdominal pain.  Genitourinary: Positive for dysuria, discharge and penile pain. Negative for urgency, hematuria and testicular pain.  Musculoskeletal: Negative for back pain, joint swelling, arthralgias and neck pain.  Skin: Negative.  Negative for rash and wound.  Neurological: Negative for dizziness, weakness, light-headedness, numbness and headaches.  Psychiatric/Behavioral: Negative.       Allergies  Review of patient's allergies indicates no known allergies.  Home Medications   Prior to Admission medications   Medication Sig Start Date End  Date Taking? Authorizing Provider  benztropine (COGENTIN) 0.5 MG tablet Take 1 tablet (0.5 mg total) by mouth 2 (two) times daily. Patient not taking: Reported on 01/23/2014 02/02/12   Charm Rings, NP  cyclobenzaprine (FLEXERIL) 10 MG tablet Take 1 tablet (10 mg total) by mouth 3 (three) times daily as needed. Patient not taking: Reported on 10/16/2014 01/23/14   Tammy Triplett, PA-C  haloperidol (HALDOL) 0.5 MG tablet Take 1 tablet (0.5 mg total) by mouth 2 (two) times daily. Patient not taking: Reported on 01/23/2014 02/02/12   Charm Rings, NP  HYDROcodone-acetaminophen (NORCO/VICODIN) 5-325 MG per tablet Take one-two tabs po q 4-6 hrs prn pain Patient not taking: Reported on 10/16/2014 01/23/14   Tammy Triplett, PA-C  ibuprofen (ADVIL,MOTRIN) 800 MG tablet Take 1 tablet (800 mg total) by mouth 3 (three) times daily. Patient not taking: Reported on 10/16/2014 01/23/14   Tammy Triplett, PA-C  sertraline (ZOLOFT) 50 MG tablet Take 2 tablets (100 mg total) by mouth daily. Patient not taking: Reported on 01/23/2014 02/02/12   Charm Rings, NP  trazodone (DESYREL) 300 MG tablet Take 1 tablet (300 mg total) by mouth at bedtime. Patient not taking: Reported on 01/23/2014 02/02/12   Charm Rings, NP   BP 125/83 mmHg  Pulse 82  Temp(Src) 97.8 F (36.6 C) (Oral)  Resp 18  Ht  (1.905 m)  Wt 285 lb (129.275 kg)  BMI 35.62 kg/m2  SpO2 100% Physical Exam  Constitutional: He appears well-developed and well-nourished.  HENT:  Head: Normocephalic and atraumatic.  Eyes: Conjunctivae are normal.  Neck: Normal range of motion.  Cardiovascular: Normal rate, regular rhythm, normal heart sounds and intact distal pulses.   Pulmonary/Chest: Effort normal and breath sounds normal. He has no wheezes.  Abdominal: Soft. Bowel sounds are normal. There is no tenderness.  Genitourinary: Testes normal. Circumcised. No penile erythema. Discharge found.  Musculoskeletal: Normal range of motion.   Neurological: He is alert.  Skin: Skin is warm and dry.  Psychiatric: He has a normal mood and affect.  Nursing note and vitals reviewed.  Chaperone was present during exam.  ED Course  Procedures (including critical care time) Labs Review Labs Reviewed  URINALYSIS, ROUTINE W REFLEX MICROSCOPIC (NOT AT [facility]) - Abnormal; Notable for the following:    Specific Gravity, Urine >1.030 (*)    All other components within normal limits  GC/CHLAMYDIA PROBE AMP ([place]) NOT AT [facility] - Abnormal; Notable for the following:    Neisseria gonorrhea **POSITIVE** (*)    All other components within normal limits  RPR  HIV ANTIBODY (ROUTINE TESTING)    Imaging Review No results found. I have personally reviewed and evaluated these images and lab results as part of my medical decision-making.   EKG Interpretation None      MDM   Final diagnoses:  Urethritis    Pt tx with rocephin IM, zithromax.  Pt aware cultures pending and he will be notified of results if positive. Instructions given to not have sex until cx results/sx resolved.  F/u with health dept if sx persist or return here.  Pt also asked for local referrals for pcp as it has been years since since by prior pcp, not active pt.  Referrals were given.  The patient appears reasonably screened and/or stabilized for discharge and I doubt any other medical condition or other [facility] requiring further screening, evaluation, or treatment in the ED at this time prior to discharge.     Burgess Amor, PA-C 10/17/14 1244  Bethann Berkshire, MD 10/17/14 [phone]

## 2014-10-16 NOTE — Discharge Instructions (Signed)
Urethritis Urethritis is an inflammation of the tube through which urine exits your bladder (urethra).  CAUSES Urethritis is often caused by an infection in your urethra. The infection can be viral, like herpes. The infection can also be bacterial, like gonorrhea or chlamydia which you have been tested for today.  You will be notified if your cultures are positive. RISK FACTORS Risk factors of urethritis include:  Having sex without using a condom.  Having multiple sexual partners.  Having poor hygiene. SIGNS AND SYMPTOMS Symptoms of urethritis are less noticeable in women than in men. These symptoms include:  Burning feeling when you urinate (dysuria).  Discharge from your urethra.  Blood in your urine (hematuria).  Urinating more than usual. DIAGNOSIS  To confirm a diagnosis of urethritis, your health care provider will do the following:  Ask about your sexual history.  Perform a physical exam.  Have you provide a sample of your urine for lab testing.  Use a cotton swab to gently collect a sample from your urethra for lab testing. TREATMENT  It is important to treat urethritis. Depending on the cause, untreated urethritis may lead to serious genital infections and possibly infertility. Urethritis caused by a bacterial infection is treated with antibiotic medicine. All sexual partners must be treated.  HOME CARE INSTRUCTIONS  Do not have sex until the test results are known and treatment is completed, even if your symptoms go away before you finish treatment.  If you were prescribed an antibiotic, finish it all even if you start to feel better. SEEK MEDICAL CARE IF:   Your symptoms are not improved in 3 days.  Your symptoms are getting worse.  You develop abdominal pain or pelvic pain (in women).  You develop joint pain.  You have a fever. SEEK IMMEDIATE MEDICAL CARE IF:   You have severe pain in the belly, back, or side.  You have repeated vomiting. MAKE SURE  YOU:  Understand these instructions.  Will watch your condition.  Will get help right away if you are not doing well or get worse. Document Released: 07/29/2000 Document Revised: 06/19/2013 Document Reviewed: 10/03/2012 [facility] Patient Information 2015 [place], [place]. This information is not intended to replace advice given to you by your health care provider. Make sure you discuss any questions you have with your health care provider.

## 2014-10-16 NOTE — ED Notes (Signed)
C/o burning when voiding.  Rates pain 8/10.

## 2014-10-17 LAB — HIV ANTIBODY (ROUTINE TESTING W REFLEX): HIV Screen 4th Generation wRfx: NONREACTIVE

## 2014-10-17 LAB — RPR: RPR Ser Ql: NONREACTIVE

## 2014-10-17 LAB — GC/CHLAMYDIA PROBE AMP (~~LOC~~) NOT AT ARMC
Chlamydia: NEGATIVE
Neisseria Gonorrhea: POSITIVE — AB

## 2014-10-18 ENCOUNTER — Telehealth (HOSPITAL_COMMUNITY): Payer: Self-pay

## 2014-10-18 NOTE — Telephone Encounter (Signed)
Positive for gonorrhea. Treated per protocol. [employer] form faxed. Attempting to contact.  

## 2014-10-20 ENCOUNTER — Telehealth (HOSPITAL_COMMUNITY): Payer: Self-pay | Admitting: Emergency Medicine

## 2014-10-21 ENCOUNTER — Telehealth (HOSPITAL_COMMUNITY): Payer: Self-pay | Admitting: Emergency Medicine

## 2014-10-21 NOTE — Telephone Encounter (Signed)
Post ED Visit - Positive Culture Follow-up: Unsuccessful Patient Follow-up  Positive Gonorrhea culture   Patient discharged without antimicrobial prescription and treatment is now indicated  Organism is resistant to prescribed ED discharge antimicrobial  Treated per protocol   Unable to contact patient after 3 attempts, letter will be sent to address on file  Lawrence Whitaker 10/21/2014, 10:13 AM

## 2014-11-25 ENCOUNTER — Telehealth (HOSPITAL_COMMUNITY): Payer: Self-pay

## 2014-11-25 NOTE — Telephone Encounter (Signed)
Unable to reach by phone or mail.  Chart closed.   

## 2015-02-21 ENCOUNTER — Emergency Department (HOSPITAL_COMMUNITY)
Admission: EM | Admit: 2015-02-21 | Discharge: 2015-02-21 | Disposition: A | Discharge status: Home / Self Care | Payer: BLUE CROSS/BLUE SHIELD | Attending: Emergency Medicine | Admitting: Emergency Medicine

## 2015-02-21 ENCOUNTER — Emergency Department (HOSPITAL_COMMUNITY): Payer: BLUE CROSS/BLUE SHIELD

## 2015-02-21 ENCOUNTER — Encounter (HOSPITAL_COMMUNITY): Payer: Self-pay | Admitting: Emergency Medicine

## 2015-02-21 DIAGNOSIS — F17210 Nicotine dependence, cigarettes, uncomplicated: Secondary | ICD-10-CM | POA: Insufficient documentation

## 2015-02-21 DIAGNOSIS — F319 Bipolar disorder, unspecified: Secondary | ICD-10-CM | POA: Insufficient documentation

## 2015-02-21 DIAGNOSIS — N342 Other urethritis: Secondary | ICD-10-CM | POA: Diagnosis not present

## 2015-02-21 DIAGNOSIS — R300 Dysuria: Secondary | ICD-10-CM | POA: Diagnosis present

## 2015-02-21 DIAGNOSIS — R319 Hematuria, unspecified: Secondary | ICD-10-CM

## 2015-02-21 LAB — URINALYSIS, ROUTINE W REFLEX MICROSCOPIC
GLUCOSE, UA: NEGATIVE mg/dL
LEUKOCYTES UA: NEGATIVE
NITRITE: NEGATIVE
PH: 6 (ref 5.0–8.0)

## 2015-02-21 LAB — URINE MICROSCOPIC-ADD ON

## 2015-02-21 MED ORDER — AZITHROMYCIN 250 MG PO TABS
1000.0000 mg | ORAL_TABLET | Freq: Once | ORAL | Status: AC
  Administered 2015-02-21: 1000 mg via ORAL
  Filled 2015-02-21: qty 4

## 2015-02-21 MED ORDER — CEFTRIAXONE SODIUM 250 MG IJ SOLR
250.0000 mg | Freq: Once | INTRAMUSCULAR | Status: AC
  Administered 2015-02-21: 250 mg via INTRAMUSCULAR
  Filled 2015-02-21: qty 250

## 2015-02-21 MED ORDER — LIDOCAINE HCL (PF) 1 % IJ SOLN
INTRAMUSCULAR | Status: AC
  Filled 2015-02-21: qty 5

## 2015-02-21 NOTE — ED Notes (Signed)
Pt states he has seen blood in urine over the last couple of days and pt states states he cannot hold his urine.

## 2015-02-21 NOTE — ED Notes (Signed)
Pt alert & oriented x4, stable gait. Patient given discharge instructions, paperwork & prescription(s). Patient  instructed to stop at the registration desk to finish any additional paperwork. Patient verbalized understanding. Pt left department w/ no further questions. 

## 2015-02-21 NOTE — ED Notes (Signed)
Pt states pain w/ urination, unable to hold bladder & blood in his urine. Pt says this started a few days ago. Was having some lower back pain when this started. 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

## 2015-02-21 NOTE — Discharge Instructions (Signed)
Urethritis, Adult °Urethritis is an inflammation of the tube through which urine exits your bladder (urethra).  °CAUSES °Urethritis is often caused by an infection in your urethra. The infection can be viral, like herpes. The infection can also be bacterial, like gonorrhea. °RISK FACTORS °Risk factors of urethritis include: °· Having sex without using a condom. °· Having multiple sexual partners. °· Having poor hygiene. °SIGNS AND SYMPTOMS °Symptoms of urethritis are less noticeable in women than in men. These symptoms include: °· Burning feeling when you urinate (dysuria). °· Discharge from your urethra. °· Blood in your urine (hematuria). °· Urinating more than usual. °DIAGNOSIS  °To confirm a diagnosis of urethritis, your health care provider will do the following: °· Ask about your sexual history. °· Perform a physical exam. °· Have you provide a sample of your urine for lab testing. °· Use a cotton swab to gently collect a sample from your urethra for lab testing. °TREATMENT  °It is important to treat urethritis. Depending on the cause, untreated urethritis may lead to serious genital infections and possibly infertility. Urethritis caused by a bacterial infection is treated with antibiotic medicine. All sexual partners must be treated.  °HOME CARE INSTRUCTIONS °· Do not have sex until the test results are known and treatment is completed, even if your symptoms go away before you finish treatment. °· If you were prescribed an antibiotic, finish it all even if you start to feel better. °SEEK MEDICAL CARE IF:  °· Your symptoms are not improved in 3 days. °· Your symptoms are getting worse. °· You develop abdominal pain or pelvic pain (in women). °· You develop joint pain. °· You have a fever. °SEEK IMMEDIATE MEDICAL CARE IF:  °· You have severe pain in the belly, back, or side. °· You have repeated vomiting. °MAKE SURE YOU: °· Understand these instructions. °· Will watch your condition. °· Will get help right away  if you are not doing well or get worse. °  °This information is not intended to replace advice given to you by your health care provider. Make sure you discuss any questions you have with your health care provider. °  °Document Released: 07/29/2000 Document Revised: 06/19/2014 Document Reviewed: 10/03/2012 °Elsevier Interactive Patient Education ©2016 [employer] ° °

## 2015-02-21 NOTE — ED Provider Notes (Signed)
CSN: [id number]     Arrival date & time 02/21/15  1938 History   First MD Initiated Contact with Patient 02/21/15 1954     Chief Complaint  Patient presents with  . Dysuria     (Consider location/radiation/quality/duration/timing/severity/associated sxs/prior Treatment) HPI Comments: Patient reports 2 day history of urgency with blood in his urine needing to rush to the bathroom with frequency. Triage note states he cannot hold his urine but patient states that he is not incontinent has not urinated on himself. He is able to make it to the bathroom on time but has to rush to get there. Denies any testicular pain. Denies any discharge. Denies any abdominal pain, nausea vomiting or fever. Had some back pain several days ago which she thought was from packing boxes and has since resolved. No focal weakness, numbness or tingling. No bowel or bladder incontinence. No fever.  Patient is a 35 y.o. male presenting with dysuria. The history is provided by the patient.  Dysuria Pertinent negatives include no chest pain, no abdominal pain, no headaches and no shortness of breath.    Past Medical History  Diagnosis Date  . Depression   . Bipolar 1 disorder [facility](HCC)    Past Surgical History  Procedure Laterality Date  . 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 No pertinent family history. Social History  Substance Use Topics  . Smoking status: Current Every Day Smoker -- 1.00 packs/day for 15 years    Types: Cigarettes  . Smokeless tobacco: None  . Alcohol Use: Yes     Comment: occasionally    Review of Systems  Constitutional: Negative for fever, activity change and appetite change.  HENT: Negative for congestion and rhinorrhea.   Eyes: Negative for visual disturbance.  Respiratory: Negative for cough, chest tightness and shortness of breath.   Cardiovascular: Negative for chest pain.  Gastrointestinal: Negative for nausea, vomiting and abdominal pain.   Genitourinary: Positive for dysuria and hematuria. Negative for scrotal swelling and testicular pain.  Musculoskeletal: Negative for myalgias and arthralgias.  Skin: Negative for wound.  Neurological: Negative for dizziness, weakness and headaches.   A complete 10 system review of systems was obtained and all systems are negative except as noted in the HPI and PMH.     Allergies  Review of patient's allergies indicates no known allergies.  Home Medications   Prior to Admission medications   Medication Sig Start Date End Date Taking? Authorizing Provider  benztropine (COGENTIN) 0.5 MG tablet Take 1 tablet (0.5 mg total) by mouth 2 (two) times daily. Patient not taking: Reported on 01/23/2014 02/02/12   Charm RingsJamison Y Lord, NP  cyclobenzaprine (FLEXERIL) 10 MG tablet Take 1 tablet (10 mg total) by mouth 3 (three) times daily as needed. Patient not taking: Reported on 10/16/2014 01/23/14   Tammy Triplett, PA-C  haloperidol (HALDOL) 0.5 MG tablet Take 1 tablet (0.5 mg total) by mouth 2 (two) times daily. Patient not taking: Reported on 01/23/2014 02/02/12   Charm RingsJamison Y Lord, NP  HYDROcodone-acetaminophen (NORCO/VICODIN) 5-325 MG per tablet Take one-two tabs po q 4-6 hrs prn pain Patient not taking: Reported on 10/16/2014 01/23/14   Tammy Triplett, PA-C  ibuprofen (ADVIL,MOTRIN) 800 MG tablet Take 1 tablet (800 mg total) by mouth 3 (three) times daily. Patient not taking: Reported on 10/16/2014 01/23/14   Tammy Triplett, PA-C  sertraline (ZOLOFT) 50 MG tablet Take 2 tablets (100 mg total) by mouth daily. Patient not taking: Reported on  01/23/2014 02/02/12   Charm Rings, NP  trazodone (DESYREL) 300 MG tablet Take 1 tablet (300 mg total) by mouth at bedtime. Patient not taking: Reported on 01/23/2014 02/02/12   Charm Rings, NP   BP 131/66 mmHg  Pulse 80  Temp(Src) 97.7 F (36.5 C) (Oral)  Resp 18  Ht 6\' 3"  (1.905 m)  Wt 285 lb (129.275 kg)  BMI 35.62 kg/m2  SpO2 100% Physical Exam   Constitutional: He is oriented to person, place, and time. He appears well-developed and well-nourished. No distress.  HENT:  Head: Normocephalic and atraumatic.  Mouth/Throat: Oropharynx is clear and moist. No oropharyngeal exudate.  Eyes: Conjunctivae and EOM are normal. Pupils are equal, round, and reactive to light.  Neck: Normal range of motion. Neck supple.  No meningismus.  Cardiovascular: Normal rate, regular rhythm, normal heart sounds and intact distal pulses.   No murmur heard. Pulmonary/Chest: Effort normal and breath sounds normal. No respiratory distress.  Abdominal: Soft. There is no tenderness. There is no rebound and no guarding.  Genitourinary:  No testicular tenderness, no discharge  Musculoskeletal: Normal range of motion. He exhibits no edema or tenderness.  5/5 strength in bilateral lower extremities. Ankle plantar and dorsiflexion intact. Great toe extension intact bilaterally. +2 DP and PT pulses. +2 patellar reflexes bilaterally. Normal gait.   Neurological: He is alert and oriented to person, place, and time. No cranial nerve deficit. He exhibits normal muscle tone. Coordination normal.  No ataxia on finger to nose bilaterally. No pronator drift. 5/5 strength throughout. CN 2-12 intact.Equal grip strength. Sensation intact.   Skin: Skin is warm.  Psychiatric: He has a normal mood and affect. His behavior is normal.  Nursing note and vitals reviewed.   ED Course  Procedures (including critical care time) Labs Review Labs Reviewed  URINALYSIS, ROUTINE W REFLEX MICROSCOPIC (NOT AT [facility]) - Abnormal; Notable for the following:    Specific Gravity, Urine >1.030 (*)    Hgb urine dipstick TRACE (*)    Bilirubin Urine SMALL (*)    Ketones, ur TRACE (*)    Protein, ur TRACE (*)    All other components within normal limits  URINE MICROSCOPIC-ADD ON - Abnormal; Notable for the following:    Squamous Epithelial / LPF 0-5 (*)    Bacteria, UA RARE (*)    Crystals CA  OXALATE CRYSTALS (*)    All other components within normal limits    Imaging Review Ct Renal Stone Study  02/21/2015  CLINICAL DATA:  Hematuria EXAM: CT ABDOMEN AND PELVIS WITHOUT CONTRAST TECHNIQUE: Multidetector CT imaging of the abdomen and pelvis was performed following the standard protocol without IV contrast. COMPARISON:  January 13, 2010 FINDINGS: 1 mm nonobstructing stones identified in both kidneys. No hydronephrosis is identified bilaterally. The liver, spleen, pancreas, gallbladder, adrenal glands are normal. The aorta is normal.  There is no abdominal lymphadenopathy. There is no small bowel obstruction or diverticulitis. The appendix is normal. Partial fluid-filled bladder is normal. Pelvic phleboliths are noted. Minimal dependent atelectasis of posterior lung bases are noted. No acute abnormalities identified within the visualized bones. IMPRESSION: Small nonobstructing stones in both kidneys. No hydronephrosis is identified bilaterally. No acute abnormalities identified in the abdomen and pelvis. Electronically Signed   By: Sherian Rein M.D.   On: 02/21/2015 20:43   I have personally reviewed and evaluated these images and lab results as part of my medical decision-making.   EKG Interpretation None      MDM  Final diagnoses:  Hematuria   patient with urinary frequency, hematuria and pain with urination. Denies any back pain, testicular pain or abdominal pain. Denies any fever. Denies any penile discharge.  Benign exam. Intact strength and sensation. No evidence of cord compression or cauda equina.  Postvoid residual 0 mL.  CT scan shows no ureteral stones. Urinalysis with few blood and white blood cells. We'll treat for urethritis with Rocephin and Zithromax. Follow up with PCP. Return precautions discussed.  Glynn Octave, MD 02/21/15 [phone]

## 2015-11-26 ENCOUNTER — Emergency Department (HOSPITAL_COMMUNITY)
Admission: EM | Admit: 2015-11-26 | Discharge: 2015-11-26 | Disposition: A | Discharge status: Home / Self Care | Payer: BLUE CROSS/BLUE SHIELD | Attending: Emergency Medicine | Admitting: Emergency Medicine

## 2015-11-26 ENCOUNTER — Encounter (HOSPITAL_COMMUNITY): Payer: Self-pay | Admitting: Emergency Medicine

## 2015-11-26 DIAGNOSIS — F17210 Nicotine dependence, cigarettes, uncomplicated: Secondary | ICD-10-CM | POA: Insufficient documentation

## 2015-11-26 DIAGNOSIS — N342 Other urethritis: Secondary | ICD-10-CM | POA: Insufficient documentation

## 2015-11-26 LAB — URINE MICROSCOPIC-ADD ON

## 2015-11-26 LAB — URINALYSIS, ROUTINE W REFLEX MICROSCOPIC
BILIRUBIN URINE: NEGATIVE
GLUCOSE, UA: NEGATIVE mg/dL
Ketones, ur: NEGATIVE mg/dL
Nitrite: NEGATIVE
PH: 5.5 (ref 5.0–8.0)

## 2015-11-26 MED ORDER — STERILE WATER FOR INJECTION IJ SOLN
INTRAMUSCULAR | Status: AC
  Administered 2015-11-26: 0.9 mL
  Filled 2015-11-26: qty 10

## 2015-11-26 MED ORDER — CEFTRIAXONE SODIUM 250 MG IJ SOLR
250.0000 mg | Freq: Once | INTRAMUSCULAR | Status: AC
  Administered 2015-11-26: 250 mg via INTRAMUSCULAR
  Filled 2015-11-26: qty 250

## 2015-11-26 MED ORDER — AZITHROMYCIN 250 MG PO TABS
1000.0000 mg | ORAL_TABLET | Freq: Once | ORAL | Status: AC
  Administered 2015-11-26: 1000 mg via ORAL
  Filled 2015-11-26: qty 4

## 2015-11-26 NOTE — ED Triage Notes (Signed)
Pt reports burning with urination since yesterday and discharge that started this am.

## 2015-11-26 NOTE — ED Provider Notes (Signed)
[facility]EM[facility] DEPT Provider Note   CSN: [id number] Arrival date & time: 11/26/15  1345     History   Chief Complaint Chief Complaint  Patient presents with  . Urinary Tract Infection    HPI Lawrence CanterburyJoseph Steenbergen is a 35 y.o. male.  The history is provided by the patient.  Penile Discharge  This is a new problem. The current episode started yesterday. The problem occurs daily. The problem has been gradually worsening. Pertinent negatives include no chest pain, no headaches and no shortness of breath. Exacerbated by: urination. Nothing relieves the symptoms. He has tried nothing for the symptoms. The treatment provided no relief.    Past Medical History:  Diagnosis Date  . Bipolar 1 disorder (HCC)   . Depression     Patient Active Problem List   Diagnosis Date Noted  . Major depressive disorder, recurrent episode, severe, with psychotic behavior (HCC) 01/28/2012    Class: Acute    Past Surgical History:  Procedure Laterality Date  . HIP SURGERY Bilateral 1996 or 1997   slipped epiphysis       Home Medications    Prior to Admission medications   Not on File    Family History History reviewed. No pertinent family history.  Social History Social History  Substance Use Topics  . Smoking status: Current Every Day Smoker    Packs/day: 1.00    Years: 15.00    Types: Cigarettes  . Smokeless tobacco: Never Used  . Alcohol use Yes     Comment: occasionally     Allergies   Review of patient's allergies indicates no known allergies.   Review of Systems Review of Systems  Respiratory: Negative for shortness of breath.   Cardiovascular: Negative for chest pain.  Genitourinary: Positive for discharge and dysuria.  Neurological: Negative for headaches.  Psychiatric/Behavioral:       Bipolar illness  All other systems reviewed and are negative.    Physical Exam Updated Vital Signs BP 121/73 (BP Location: Left Arm)   Pulse 88   Temp 98.8 F (37.1 C)  (Oral)   Resp 20   Ht 6\' 3"  (1.905 m)   Wt 111.1 kg   SpO2 98%   BMI 30.62 kg/m   Physical Exam  Constitutional: He is oriented to person, place, and time. He appears well-developed and well-nourished.  Non-toxic appearance.  HENT:  Head: Normocephalic.  Right Ear: Tympanic membrane and external ear normal.  Left Ear: Tympanic membrane and external ear normal.  Eyes: EOM and lids are normal. Pupils are equal, round, and reactive to light.  Neck: Normal range of motion. Neck supple. Carotid bruit is not present.  Cardiovascular: Normal rate, regular rhythm, normal heart sounds, intact distal pulses and normal pulses.   Pulmonary/Chest: Breath sounds normal. No respiratory distress.  Abdominal: Soft. Bowel sounds are normal. There is no tenderness. There is no guarding.  Genitourinary: Testes normal. Circumcised. No paraphimosis, penile erythema or penile tenderness. Discharge found.  Musculoskeletal: Normal range of motion.  Lymphadenopathy:       Head (right side): No submandibular adenopathy present.       Head (left side): No submandibular adenopathy present.    He has no cervical adenopathy.  Neurological: He is alert and oriented to person, place, and time. He has normal strength. No cranial nerve deficit or sensory deficit.  Skin: Skin is warm and dry.  Psychiatric: He has a normal mood and affect. His speech is normal.  Nursing note and vitals reviewed.  ED Treatments / Results  Labs (all labs ordered are listed, but only abnormal results are displayed) Labs Reviewed  URINALYSIS, ROUTINE W REFLEX MICROSCOPIC (NOT AT [facility]) - Abnormal; Notable for the following:       Result Value   Specific Gravity, Urine >1.030 (*)    Hgb urine dipstick SMALL (*)    Protein, ur TRACE (*)    Leukocytes, UA SMALL (*)    All other components within normal limits  URINE MICROSCOPIC-ADD ON - Abnormal; Notable for the following:    Squamous Epithelial / LPF 0-5 (*)    Bacteria, UA MANY  (*)    All other components within normal limits  URINE CULTURE  RPR  HIV ANTIBODY (ROUTINE TESTING)  GC/CHLAMYDIA PROBE AMP ([place]) NOT AT [facility]    EKG  EKG Interpretation None       Radiology No results found.  Procedures Procedures (including critical care time)  Medications Ordered in ED Medications - No data to display   Initial Impression / Assessment and Plan / ED Course  I have reviewed the triage vital signs and the nursing notes.  Pertinent labs & imaging results that were available during my care of the patient were reviewed by me and considered in my medical decision making (see chart for details).  Clinical Course    **I have reviewed nursing notes, vital signs, and all appropriate lab and imaging results for this patient.*  Final Clinical Impressions(s) / ED Diagnoses  Vital signs within normal limits. GC chlamydia, RPR, and HIV sent to the lab. Patient treated in the emergency department with Rocephin and Zithromax. Discussed safe sex practices with the patient. Patient is in agreement with this plan.    Final diagnoses:  Urethritis    New Prescriptions New Prescriptions   No medications on file     Ivery Quale, Cordelia Poche 11/27/15 2124    Donnetta Hutching, MD 11/29/15 1345

## 2015-11-26 NOTE — Discharge Instructions (Signed)
You were treated in the emergency department with intramuscular Rocephin, and oral Zithromax. Please refrain from all sexual activity over the next 7 days. It is important that she use adequate protection during intercourse, and practice safe sex.

## 2015-11-26 NOTE — ED Notes (Signed)
Pt reports that he started to have burning during urination yesterday and today he started to have a yellow colored discharge coming from his penis. Pt reports unprotected sex with multiple partners.

## 2015-11-27 ENCOUNTER — Other Ambulatory Visit: Payer: Self-pay | Admitting: Advanced Practice Midwife

## 2015-11-27 LAB — HIV ANTIBODY (ROUTINE TESTING W REFLEX): HIV Screen 4th Generation wRfx: NONREACTIVE

## 2015-11-27 LAB — GC/CHLAMYDIA PROBE AMP (~~LOC~~) NOT AT ARMC
CHLAMYDIA, DNA PROBE: POSITIVE — AB
NEISSERIA GONORRHEA: POSITIVE — AB

## 2015-11-27 LAB — RPR: RPR: NONREACTIVE

## 2015-11-27 MED ORDER — AZITHROMYCIN 500 MG PO TABS: 1000.0000 mg | ORAL_TABLET | Freq: Once | ORAL | 0 refills | Status: AC

## 2015-11-27 NOTE — Progress Notes (Signed)
Partner (crystal brown) + [facility].  Rx azithromycin 1gm PO

## 2015-11-28 ENCOUNTER — Telehealth (HOSPITAL_BASED_OUTPATIENT_CLINIC_OR_DEPARTMENT_OTHER): Payer: Self-pay | Admitting: Emergency Medicine

## 2015-11-28 LAB — URINE CULTURE: CULTURE: NO GROWTH

## 2015-12-26 ENCOUNTER — Emergency Department (HOSPITAL_COMMUNITY)
Admission: EM | Admit: 2015-12-26 | Discharge: 2015-12-26 | Disposition: A | Discharge status: Home / Self Care | Payer: Self-pay | Attending: Emergency Medicine | Admitting: Emergency Medicine

## 2015-12-26 ENCOUNTER — Encounter (HOSPITAL_COMMUNITY): Payer: Self-pay | Admitting: Emergency Medicine

## 2015-12-26 DIAGNOSIS — S61412A Laceration without foreign body of left hand, initial encounter: Secondary | ICD-10-CM | POA: Insufficient documentation

## 2015-12-26 DIAGNOSIS — Y999 Unspecified external cause status: Secondary | ICD-10-CM | POA: Insufficient documentation

## 2015-12-26 DIAGNOSIS — Y939 Activity, unspecified: Secondary | ICD-10-CM | POA: Insufficient documentation

## 2015-12-26 DIAGNOSIS — Y929 Unspecified place or not applicable: Secondary | ICD-10-CM | POA: Insufficient documentation

## 2015-12-26 DIAGNOSIS — Z23 Encounter for immunization: Secondary | ICD-10-CM | POA: Insufficient documentation

## 2015-12-26 DIAGNOSIS — F17210 Nicotine dependence, cigarettes, uncomplicated: Secondary | ICD-10-CM | POA: Insufficient documentation

## 2015-12-26 DIAGNOSIS — W268XXA Contact with other sharp object(s), not elsewhere classified, initial encounter: Secondary | ICD-10-CM | POA: Insufficient documentation

## 2015-12-26 MED ORDER — TETANUS-DIPHTH-ACELL PERTUSSIS 5-2.5-18.5 LF-MCG/0.5 IM SUSP
0.5000 mL | Freq: Once | INTRAMUSCULAR | Status: AC
  Administered 2015-12-26: 0.5 mL via INTRAMUSCULAR
  Filled 2015-12-26: qty 0.5

## 2015-12-26 NOTE — Discharge Instructions (Signed)
Please cleanse the wound with soap and water daily. Apply a bandage until healed. Please mark your records that you received a tetanus shot today.

## 2015-12-26 NOTE — ED Provider Notes (Signed)
[facility] Provider Note   CSN: [id number] Arrival date & time: 12/26/15  1048     History   Chief Complaint Chief Complaint  Patient presents with  . Hand Injury    HPI Lawrence Whitaker is a 35 y.o. male.  Patient is 35 year old male who presents to the emergency department with a laceration to the left hand.  The patient states that he cut his left hand on a piece of metal this a.m. He was able to control the bleeding by applying pressure. He states he is unsure of the date of his last tetanus and needs a tetanus shot. Is no difficulty with moving the fingers or the wrist. Is no change in his sensation. The patient denies being on any anticoagulation medications.    Hand Injury      Past Medical History:  Diagnosis Date  . Bipolar 1 disorder (HCC)   . Depression     Patient Active Problem List   Diagnosis Date Noted  . Major depressive disorder, recurrent episode, severe, with psychotic behavior (HCC) 01/28/2012    Class: Acute    Past Surgical History:  Procedure Laterality Date  . HIP SURGERY Bilateral 1996 or 1997   slipped epiphysis       Home Medications    Prior to Admission medications   Not on File    Family History History reviewed. No pertinent family history.  Social History Social History  Substance Use Topics  . Smoking status: Current Every Day Smoker    Packs/day: 1.00    Years: 15.00    Types: Cigarettes  . Smokeless tobacco: Never Used  . Alcohol use Yes     Comment: occasionally     Allergies   Patient has no known allergies.   Review of Systems Review of Systems  Constitutional: Negative for activity change.       All ROS Neg except as noted in HPI  HENT: Negative for nosebleeds.   Eyes: Negative for photophobia and discharge.  Respiratory: Negative for cough, shortness of breath and wheezing.   Cardiovascular: Negative for chest pain and palpitations.  Gastrointestinal: Negative for abdominal pain and  blood in stool.  Genitourinary: Negative for dysuria, frequency and hematuria.  Musculoskeletal: Negative for arthralgias, back pain and neck pain.  Skin: Negative.   Neurological: Negative for dizziness, seizures and speech difficulty.  Psychiatric/Behavioral: Negative for confusion and hallucinations.     Physical Exam Updated Vital Signs BP 129/72 (BP Location: Left Arm)   Pulse 85   Temp 97.4 F (36.3 C) (Oral)   Resp 16   Ht 6\' 3"  (1.905 m)   Wt 111.1 kg   SpO2 100%   BMI 30.62 kg/m   Physical Exam  Constitutional: He is oriented to person, place, and time. He appears well-developed and well-nourished.  Non-toxic appearance.  HENT:  Head: Normocephalic.  Right Ear: Tympanic membrane and external ear normal.  Left Ear: Tympanic membrane and external ear normal.  Eyes: EOM and lids are normal. Pupils are equal, round, and reactive to light.  Neck: Normal range of motion. Neck supple. Carotid bruit is not present.  Cardiovascular: Normal rate, regular rhythm, normal heart sounds, intact distal pulses and normal pulses.   Pulmonary/Chest: Breath sounds normal. No respiratory distress.  Abdominal: Soft. Bowel sounds are normal. There is no tenderness. There is no guarding.  Musculoskeletal: Normal range of motion.       Hands: Lymphadenopathy:       Head (right side): No submandibular  adenopathy present.       Head (left side): No submandibular adenopathy present.    He has no cervical adenopathy.  Neurological: He is alert and oriented to person, place, and time. He has normal strength. No cranial nerve deficit or sensory deficit.  Skin: Skin is warm and dry.  Psychiatric: He has a normal mood and affect. His speech is normal.  Nursing note and vitals reviewed.    ED Treatments / Results  Labs (all labs ordered are listed, but only abnormal results are displayed) Labs Reviewed - No data to display  EKG  EKG Interpretation None       Radiology No results  found.  Procedures Procedures (including critical care time)  Medications Ordered in ED Medications  Tdap (BOOSTRIX) injection 0.5 mL (0.5 mLs Intramuscular Given 12/26/15 1206)     Initial Impression / Assessment and Plan / ED Course  I have reviewed the triage vital signs and the nursing notes.  Pertinent labs & imaging results that were available during my care of the patient were reviewed by me and considered in my medical decision making (see chart for details).  Clinical Course     *I have reviewed nursing notes, vital signs, and all appropriate lab and imaging results for this patient.**  Final Clinical Impressions(s) / ED Diagnoses  Vital signs reviewed. Patient's tetanus updated. The wound to the hand is not a candidate for suture. The wound was dressed with 4 x 4's and [place]. Patient advised to wash the hands daily with soap and water and apply dressing until the wound is healed.    Final diagnoses:  Laceration of left hand without foreign body, initial encounter    New Prescriptions New Prescriptions   No medications on file     Ivery QualeHobson Angelyna Henderson, PA-C 12/26/15 1401    Bethann BerkshireJoseph Zammit, MD 12/28/15 [phone]

## 2015-12-26 NOTE — ED Notes (Signed)
Covered wound with sterile 4x4 and wrapped with sterile gauze. Patient tolerated well.

## 2015-12-26 NOTE — ED Triage Notes (Signed)
Pt states he cut his L hand on a piece of metal this am. Superficial wound with no bleeding. Pt states he has not had an updated tetanus shot.

## 2015-12-31 ENCOUNTER — Telehealth (HOSPITAL_BASED_OUTPATIENT_CLINIC_OR_DEPARTMENT_OTHER): Payer: Self-pay | Admitting: Emergency Medicine

## 2015-12-31 NOTE — Telephone Encounter (Signed)
No response to letter, LOST TO FOLLOWUP 

## 2017-10-07 ENCOUNTER — Emergency Department (HOSPITAL_COMMUNITY): Payer: Self-pay

## 2017-10-07 ENCOUNTER — Emergency Department (HOSPITAL_COMMUNITY)
Admission: EM | Admit: 2017-10-07 | Discharge: 2017-10-07 | Disposition: A | Discharge status: Home / Self Care | Payer: Self-pay | Attending: Emergency Medicine | Admitting: Emergency Medicine

## 2017-10-07 ENCOUNTER — Other Ambulatory Visit: Payer: Self-pay

## 2017-10-07 ENCOUNTER — Encounter (HOSPITAL_COMMUNITY): Payer: Self-pay | Admitting: Emergency Medicine

## 2017-10-07 DIAGNOSIS — F17210 Nicotine dependence, cigarettes, uncomplicated: Secondary | ICD-10-CM | POA: Insufficient documentation

## 2017-10-07 DIAGNOSIS — F319 Bipolar disorder, unspecified: Secondary | ICD-10-CM | POA: Insufficient documentation

## 2017-10-07 DIAGNOSIS — R079 Chest pain, unspecified: Secondary | ICD-10-CM | POA: Insufficient documentation

## 2017-10-07 LAB — CBC
HCT: 40.4 % (ref 39.0–52.0)
Hemoglobin: 13.4 g/dL (ref 13.0–17.0)
MCH: 30.7 pg (ref 26.0–34.0)
MCHC: 33.2 g/dL (ref 30.0–36.0)
MCV: 92.7 fL (ref 78.0–100.0)
Platelets: 256 10*3/uL (ref 150–400)
RBC: 4.36 MIL/uL (ref 4.22–5.81)
RDW: 14 % (ref 11.5–15.5)
WBC: 5.6 10*3/uL (ref 4.0–10.5)

## 2017-10-07 LAB — BASIC METABOLIC PANEL
Anion gap: 7 (ref 5–15)
BUN: 19 mg/dL (ref 6–20)
CO2: 24 mmol/L (ref 22–32)
Calcium: 9 mg/dL (ref 8.9–10.3)
Chloride: 107 mmol/L (ref 98–111)
Creatinine, Ser: 1.35 mg/dL — ABNORMAL HIGH (ref 0.61–1.24)
GFR calc Af Amer: >60 mL/min (ref >60)
GFR calc non Af Amer: >60 mL/min (ref >60)
Glucose, Bld: 107 mg/dL — ABNORMAL HIGH (ref 70–99)
Potassium: 3.7 mmol/L (ref 3.5–5.1)
Sodium: 138 mmol/L (ref 135–145)

## 2017-10-07 LAB — TROPONIN I: Troponin I: <0.03 ng/mL (ref <0.03)

## 2017-10-07 NOTE — ED Triage Notes (Signed)
Pt reports having a witnessed syncopal episode that lasted about 4 min around 9:30 last night. Pt refused EMS transport. States since incident, he has central and LT sided, non-radiating cp with accompanied weakness and SOB. States, "I feel like I am in a daze." Family denies seizure-like activity. NAD noted. VS WDL.

## 2017-10-07 NOTE — ED Provider Notes (Signed)
[facility]ENC[facility] Provider Note   CSN: [id number] Arrival date & time: 10/07/17  1234     History   Chief Complaint Chief Complaint  Patient presents with  . Chest Pain    HPI Janece CanterburyJoseph Milosevic is a 37 y.o. male.  HPI   37 year old male with chest pain.  Patient reports he thinks he had a syncopal event around 930 last night while walking with some friends.  Patient states that he remembers feeling nauseated then remembers waking up on his ground with his friends around him.  Since that time he has had some pain in the left side of his chest which has been persistent.  No appreciable exacerbating relieving factors.  Since last night he has felt "dazed."  Feels like he has no energy today.  Denies any past history of recurrent syncope.  Denies any sick past medical history.  Denies any drug use.  Prior to this event yesterday he was in his usual state of health  Past Medical History:  Diagnosis Date  . Bipolar 1 disorder (HCC)   . Depression     Patient Active Problem List   Diagnosis Date Noted  . Major depressive disorder, recurrent episode, severe, with psychotic behavior (HCC) 01/28/2012    Class: Acute    Past Surgical History:  Procedure Laterality Date  . HIP SURGERY Bilateral 1996 or 1997   slipped epiphysis        Home Medications    Prior to Admission medications   Medication Sig Start Date End Date Taking? Authorizing Provider  amoxicillin-clavulanate (AUGMENTIN) 875-125 MG tablet Take 1 tablet by mouth 2 (two) times daily. 09/13/17   [provider]  HYDROcodone-acetaminophen (NORCO/VICODIN) 5-325 MG tablet Take 1 tablet by mouth every 6 (six) hours as needed. for pain 09/13/17   [provider]    Family History No family history on file.  Social History Social History   Tobacco Use  . Smoking status: Current Every Day Smoker    Packs/day: 1.00    Years: 15.00    Pack years: 15.00    Types: Cigarettes  . Smokeless  tobacco: Never Used  Substance Use Topics  . Alcohol use: Yes    Comment: occasionally  . Drug use: Yes    Types: Marijuana    Comment: last use yesterday     Allergies   Patient has no known allergies.   Review of Systems Review of Systems All systems reviewed and negative, other than as noted in HPI.   Physical Exam Updated Vital Signs BP 119/73 (BP Location: Right Arm)   Pulse 71   Temp 97.9 F (36.6 C) (Oral)   Resp 14   Ht 6\' 3"  (1.905 m)   Wt 122.5 kg   SpO2 99%   BMI 33.75 kg/m   Physical Exam  Constitutional: He appears well-developed and well-nourished. No distress.  HENT:  Head: Normocephalic and atraumatic.  Eyes: Conjunctivae are normal. Right eye exhibits no discharge. Left eye exhibits no discharge.  Neck: Neck supple.  Cardiovascular: Normal rate, regular rhythm and normal heart sounds. Exam reveals no gallop and no friction rub.  No murmur heard. Pulmonary/Chest: Effort normal and breath sounds normal. No respiratory distress.  Abdominal: Soft. He exhibits no distension. There is no tenderness.  Musculoskeletal: He exhibits no edema or tenderness.  Lower extremities symmetric as compared to each other. No calf tenderness. Negative Homan's. No palpable cords.   Neurological: He is alert.  Skin: Skin is warm and  dry.  Psychiatric: He has a normal mood and affect. His behavior is normal. Thought content normal.  Nursing note and vitals reviewed.    ED Treatments / Results  Labs (all labs ordered are listed, but only abnormal results are displayed) Labs Reviewed  BASIC METABOLIC PANEL - Abnormal; Notable for the following components:      Result Value   Glucose, Bld 107 (*)    Creatinine, Ser 1.35 (*)    All other components within normal limits  CBC  TROPONIN I    EKG EKG Interpretation  Date/Time:  Thursday October 07 2017 12:42:08 EDT Ventricular Rate:  69 PR Interval:    QRS Duration: 106 QT Interval:  401 QTC Calculation: 430 R  Axis:   77 Text Interpretation:  Sinus rhythm No old tracing to compare Confirmed by Raeford Razor [phone]) on 10/07/2017 1:47:52 PM   Radiology Dg Chest 2 View  Result Date: 10/07/2017 CLINICAL DATA:  Chest pain for 1 day, syncopal episode last night, smoking history EXAM: CHEST - 2 VIEW COMPARISON:  Chest x-ray of 05/10/2014 FINDINGS: No active infiltrate or effusion is seen. Mediastinal and hilar contours are unremarkable. The heart is within normal limits in size. There does appear to be bilateral gynecomastia present. Metallic artifacts overlie the lower left neck. IMPRESSION: 1. No active lung disease. 2. Gynecomastia. Electronically Signed   By: Dwyane Dee M.D.   On: 10/07/2017 13:15    Procedures Procedures (including critical care time)  Medications Ordered in ED Medications - No data to display   Initial Impression / Assessment and Plan / ED Course  I have reviewed the triage vital signs and the nursing notes.  Pertinent labs & imaging results that were available during my care of the patient were reviewed by me and considered in my medical decision making (see chart for details).     37 year old male with chest pain.  Sounds atypical for ACS given constant duration since yesterday.  EKG is nondiagnostic.  Doubt ACS, PE, dissection or other emergent process.  Final Clinical Impressions(s) / ED Diagnoses   Final diagnoses:  Nonspecific chest pain    ED Discharge Orders    None       Raeford Razor, MD 10/07/17 [phone]

## 2018-11-18 ENCOUNTER — Other Ambulatory Visit: Payer: Self-pay | Admitting: *Deleted

## 2018-11-18 DIAGNOSIS — Z20822 Contact with and (suspected) exposure to covid-19: Secondary | ICD-10-CM

## 2018-11-19 LAB — NOVEL CORONAVIRUS, NAA: SARS-CoV-2, NAA: NOT DETECTED

## 2019-01-07 ENCOUNTER — Other Ambulatory Visit: Payer: Self-pay

## 2019-01-07 ENCOUNTER — Emergency Department (HOSPITAL_COMMUNITY)
Admission: EM | Admit: 2019-01-07 | Discharge: 2019-01-07 | Disposition: A | Discharge status: Home / Self Care | Payer: No Typology Code available for payment source | Attending: Emergency Medicine | Admitting: Emergency Medicine

## 2019-01-07 ENCOUNTER — Emergency Department (HOSPITAL_COMMUNITY): Payer: No Typology Code available for payment source

## 2019-01-07 DIAGNOSIS — F17210 Nicotine dependence, cigarettes, uncomplicated: Secondary | ICD-10-CM | POA: Insufficient documentation

## 2019-01-07 DIAGNOSIS — M25512 Pain in left shoulder: Secondary | ICD-10-CM | POA: Insufficient documentation

## 2019-01-07 DIAGNOSIS — Y92410 Unspecified street and highway as the place of occurrence of the external cause: Secondary | ICD-10-CM | POA: Insufficient documentation

## 2019-01-07 DIAGNOSIS — S0012XA Contusion of left eyelid and periocular area, initial encounter: Secondary | ICD-10-CM | POA: Diagnosis not present

## 2019-01-07 DIAGNOSIS — Y999 Unspecified external cause status: Secondary | ICD-10-CM | POA: Insufficient documentation

## 2019-01-07 DIAGNOSIS — M25511 Pain in right shoulder: Secondary | ICD-10-CM | POA: Diagnosis not present

## 2019-01-07 DIAGNOSIS — M542 Cervicalgia: Secondary | ICD-10-CM | POA: Insufficient documentation

## 2019-01-07 DIAGNOSIS — Z23 Encounter for immunization: Secondary | ICD-10-CM | POA: Insufficient documentation

## 2019-01-07 DIAGNOSIS — S0990XA Unspecified injury of head, initial encounter: Secondary | ICD-10-CM | POA: Insufficient documentation

## 2019-01-07 DIAGNOSIS — Y9389 Activity, other specified: Secondary | ICD-10-CM | POA: Diagnosis not present

## 2019-01-07 MED ORDER — TETANUS-DIPHTH-ACELL PERTUSSIS 5-2.5-18.5 LF-MCG/0.5 IM SUSP
0.5000 mL | Freq: Once | INTRAMUSCULAR | Status: AC
  Administered 2019-01-07: 0.5 mL via INTRAMUSCULAR
  Filled 2019-01-07: qty 0.5

## 2019-01-07 MED ORDER — CYCLOBENZAPRINE HCL 10 MG PO TABS: 10.0000 mg | ORAL_TABLET | Freq: Two times a day (BID) | ORAL | 0 refills | Status: DC | PRN

## 2019-01-07 MED ORDER — FENTANYL CITRATE (PF) 100 MCG/2ML IJ SOLN
50.0000 ug | Freq: Once | INTRAMUSCULAR | Status: AC
  Administered 2019-01-07: 04:00:00 50 ug via INTRAVENOUS
  Filled 2019-01-07: qty 2

## 2019-01-07 MED ORDER — IBUPROFEN 400 MG PO TABS: 400.0000 mg | ORAL_TABLET | Freq: Four times a day (QID) | ORAL | 0 refills | Status: DC | PRN
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
## 2019-01-07 NOTE — ED Notes (Signed)
Wasted 50 mcg of IV Fentanyl after pt was discharged with [place], Therapist, sports.
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
## 2019-03-28 ENCOUNTER — Other Ambulatory Visit: Payer: Self-pay

## 2019-03-28 ENCOUNTER — Ambulatory Visit: Payer: 59 | Attending: Internal Medicine

## 2019-03-28 DIAGNOSIS — Z20822 Contact with and (suspected) exposure to covid-19: Secondary | ICD-10-CM

## 2019-03-29 LAB — NOVEL CORONAVIRUS, NAA: SARS-CoV-2, NAA: NOT DETECTED

## 2019-05-30 ENCOUNTER — Encounter (HOSPITAL_COMMUNITY): Payer: Self-pay

## 2019-05-30 ENCOUNTER — Other Ambulatory Visit: Payer: Self-pay

## 2019-05-30 ENCOUNTER — Emergency Department (HOSPITAL_COMMUNITY)
Admission: EM | Admit: 2019-05-30 | Discharge: 2019-05-30 | Disposition: A | Discharge status: Home / Self Care | Payer: 59 | Attending: Emergency Medicine | Admitting: Emergency Medicine

## 2019-05-30 DIAGNOSIS — Z20822 Contact with and (suspected) exposure to covid-19: Secondary | ICD-10-CM | POA: Diagnosis not present

## 2019-05-30 DIAGNOSIS — B349 Viral infection, unspecified: Secondary | ICD-10-CM | POA: Insufficient documentation

## 2019-05-30 DIAGNOSIS — R0981 Nasal congestion: Secondary | ICD-10-CM | POA: Diagnosis present

## 2019-05-30 DIAGNOSIS — F17210 Nicotine dependence, cigarettes, uncomplicated: Secondary | ICD-10-CM | POA: Diagnosis not present

## 2019-05-30 MED ORDER — ALBUTEROL SULFATE HFA 108 (90 BASE) MCG/ACT IN AERS: 1.0000 | INHALATION_SPRAY | Freq: Four times a day (QID) | RESPIRATORY_TRACT | 0 refills | Status: AC | PRN

## 2019-05-30 MED ORDER — BENZONATATE 100 MG PO CAPS: 100.0000 mg | ORAL_CAPSULE | Freq: Three times a day (TID) | ORAL | 0 refills | Status: DC

## 2019-05-30 MED ORDER — PREDNISONE 10 MG (21) PO TBPK: ORAL_TABLET | ORAL | 0 refills | Status: DC

## 2019-05-30 MED ORDER — FLUTICASONE PROPIONATE 50 MCG/ACT NA SUSP: 2.0000 | Freq: Every day | NASAL | 2 refills | Status: DC

## 2019-05-30 NOTE — ED Triage Notes (Signed)
Pt presents to ED with complaints of cough, head congestion, generalized body aches, diarrhea, fever and chills since Sunday

## 2019-05-30 NOTE — ED Provider Notes (Signed)
[facility] EMERGENCY DEPARTMENT Provider Note   CSN: [id number] Arrival date & time: 05/30/19  1242     History Chief Complaint  Patient presents with  . Flu Like Symptoms    Lawrence Whitaker is a 39 y.o. male.  HPI      Lawrence Whitaker is a 39 y.o. male, with a history of bipolar, presenting to the ED with a variety of symptoms for the last 3 days.  Lawrence Whitaker complains of nasal congestion, intermittent headache, sore throat, fatigue, occasional cough, occasional shortness of breath, chills, body aches, and diarrhea. Lawrence Whitaker has tried Warden/ranger. Lawrence Whitaker denies documented fever, vomiting, hematochezia/melena, abdominal pain, chest pain, syncope, neurologic deficits, or any other complaints.     Past Medical History:  Diagnosis Date  . Bipolar 1 disorder (HCC)   . Depression     Patient Active Problem List   Diagnosis Date Noted  . Major depressive disorder, recurrent episode, severe, with psychotic behavior (HCC) 01/28/2012    Class: Acute    Past Surgical History:  Procedure Laterality Date  . HIP SURGERY Bilateral 1996 or 1997   slipped epiphysis       No family history on file.  Social History   Tobacco Use  . Smoking status: Current Every Day Smoker    Packs/day: 1.00    Years: 15.00    Pack years: 15.00    Types: Cigarettes  . Smokeless tobacco: Never Used  Substance Use Topics  . Alcohol use: Yes    Comment: occasionally  . Drug use: Yes    Types: Marijuana    Comment: last use yesterday    Home Medications Prior to Admission medications   Medication Sig Start Date End Date Taking? Authorizing Provider  albuterol (VENTOLIN HFA) 108 (90 Base) MCG/ACT inhaler Inhale 1-2 puffs into the lungs every 6 (six) hours as needed for wheezing or shortness of breath. 05/30/19   Yusuf Yu C, PA-C  amoxicillin-clavulanate (AUGMENTIN) 875-125 MG tablet Take 1 tablet by mouth 2 (two) times daily. 09/13/17   [provider]  benzonatate (TESSALON) 100 MG  capsule Take 1 capsule (100 mg total) by mouth every 8 (eight) hours. 05/30/19   Immanuel Fedak C, PA-C  cyclobenzaprine (FLEXERIL) 10 MG tablet Take 1 tablet (10 mg total) by mouth 2 (two) times daily as needed for muscle spasms. 01/07/19   Mesner, Barbara Cower, MD  fluticasone (FLONASE) 50 MCG/ACT nasal spray Place 2 sprays into both nostrils daily. 05/30/19   Dania Marsan C, PA-C  HYDROcodone-acetaminophen (NORCO/VICODIN) 5-325 MG tablet Take 1 tablet by mouth every 6 (six) hours as needed. for pain 09/13/17   [provider]  ibuprofen (ADVIL) 400 MG tablet Take 1 tablet (400 mg total) by mouth every 6 (six) hours as needed. 01/07/19   Mesner, Barbara Cower, MD  predniSONE (STERAPRED UNI-PAK 21 TAB) 10 MG (21) TBPK tablet Take 6 tabs (60mg ) day 1, 5 tabs (50mg ) day 2, 4 tabs (40mg ) day 3, 3 tabs (30mg ) day 4, 2 tabs (20mg ) day 5, and 1 tab (10mg ) day 6. 05/30/19   Tyrece Vanterpool C, PA-C    Allergies    Patient has no known allergies.  Review of Systems   Review of Systems  Constitutional: Positive for chills.  HENT: Positive for congestion, rhinorrhea and sore throat. Negative for ear discharge, facial swelling, trouble swallowing and voice change.   Respiratory: Positive for cough and shortness of breath (occasional).   Cardiovascular: Negative for chest pain.  Gastrointestinal: Positive for diarrhea. Negative  for abdominal pain, blood in stool, nausea and vomiting.  Musculoskeletal: Positive for myalgias.  Neurological: Positive for headaches. Negative for dizziness, syncope, weakness and numbness.  All other systems reviewed and are negative.   Physical Exam Updated Vital Signs BP 121/76 (BP Location: Right Arm)   Pulse 85   Temp 98.1 F (36.7 C) (Oral)   Resp 18   Ht 6\' 3"  (1.905 m)   Wt 117.9 kg   SpO2 100%   BMI 32.50 kg/m   Physical Exam Vitals and nursing note reviewed.  Constitutional:      General: Lawrence Whitaker is not in acute distress.    Appearance: Lawrence Whitaker is well-developed. Lawrence Whitaker is not  diaphoretic.  HENT:     Head: Normocephalic and atraumatic.     Right Ear: Tympanic membrane, ear canal and external ear normal.     Left Ear: Tympanic membrane, ear canal and external ear normal.     Nose: Mucosal edema, congestion and rhinorrhea present.     Right Sinus: No maxillary sinus tenderness or frontal sinus tenderness.     Left Sinus: No maxillary sinus tenderness or frontal sinus tenderness.     Mouth/Throat:     Mouth: Mucous membranes are moist.     Pharynx: Uvula midline. Pharyngeal swelling and posterior oropharyngeal erythema present. No oropharyngeal exudate.  Eyes:     Conjunctiva/sclera: Conjunctivae normal.  Cardiovascular:     Rate and Rhythm: Normal rate and regular rhythm.     Pulses: Normal pulses.          Radial pulses are 2+ on the right side and 2+ on the left side.       Posterior tibial pulses are 2+ on the right side and 2+ on the left side.     Heart sounds: Normal heart sounds.     Comments: Tactile temperature in the extremities appropriate and equal bilaterally. Pulmonary:     Effort: Pulmonary effort is normal. No respiratory distress.     Breath sounds: Normal breath sounds.     Comments: No increased work of breathing.  Speaks in full sentences without difficulty. Abdominal:     Palpations: Abdomen is soft.     Tenderness: There is no abdominal tenderness. There is no guarding.  Musculoskeletal:     Cervical back: Neck supple.     Right lower leg: No edema.     Left lower leg: No edema.  Lymphadenopathy:     Cervical: No cervical adenopathy.  Skin:    General: Skin is warm and dry.  Neurological:     Mental Status: Lawrence Whitaker is alert.  Psychiatric:        Mood and Affect: Mood and affect normal.        Speech: Speech normal.        Behavior: Behavior normal.     ED Results / Procedures / Treatments   Labs (all labs ordered are listed, but only abnormal results are displayed) Labs Reviewed  SARS CORONAVIRUS 2 (TAT 6-24 HRS)     EKG None  Radiology No results found.  Procedures Procedures (including critical care time)  Medications Ordered in ED Medications - No data to display  ED Course  I have reviewed the triage vital signs and the nursing notes.  Pertinent labs & imaging results that were available during my care of the patient were reviewed by me and considered in my medical decision making (see chart for details).    MDM Rules/Calculators/A&P  Patient presents with a variety of symptoms that suggest viral syndrome. Patient is nontoxic appearing, afebrile, not tachycardic, not tachypneic, not hypotensive, excellent SPO2 on room air, and is in no apparent distress.  We decided upon a plan of care that includes addressing patient's symptoms. The patient was given instructions for home care as well as return precautions. Patient voices understanding of these instructions, accepts the plan, and is comfortable with discharge.   Lawrence Whitaker was evaluated in Emergency Department on 05/30/2019 for the symptoms described in the history of present illness. Lawrence Whitaker was evaluated in the context of the global COVID-19 pandemic, which necessitated consideration that the patient might be at risk for infection with the SARS-CoV-2 virus that causes COVID-19. Institutional protocols and algorithms that pertain to the evaluation of patients at risk for COVID-19 are in a state of rapid change based on information released by regulatory bodies including the CDC and federal and state organizations. These policies and algorithms were followed during the patient's care in the ED.   Final Clinical Impression(s) / ED Diagnoses Final diagnoses:  Person under investigation for COVID-19  Viral syndrome    Rx / DC Orders ED Discharge Orders         Ordered    albuterol (VENTOLIN HFA) 108 (90 Base) MCG/ACT inhaler  Every 6 hours PRN     05/30/19 1319    predniSONE (STERAPRED UNI-PAK 21 TAB) 10 MG (21)  TBPK tablet     05/30/19 1319    benzonatate (TESSALON) 100 MG capsule  Every 8 hours     05/30/19 1319    fluticasone (FLONASE) 50 MCG/ACT nasal spray  Daily     05/30/19 1319           Anselm Pancoast, PA-C 05/30/19 1424    Jacalyn Lefevre, MD 05/30/19 1458

## 2019-05-30 NOTE — Discharge Instructions (Addendum)
General Viral Syndrome Care Instructions:  Your symptoms are likely consistent with a viral illness. Viruses do not require or respond to antibiotics. Treatment is symptomatic care and it is important to note that these symptoms may last for 7-14 days.   Hand washing: Wash your hands throughout the day, but especially before and after touching the face, using the restroom, sneezing, coughing, or touching surfaces that have been coughed or sneezed upon. Hydration: Symptoms of most illnesses will be intensified and complicated by dehydration. Dehydration can also extend the duration of symptoms. Drink plenty of fluids and get plenty of rest. You should be drinking at least half a liter of water an hour to stay hydrated. Electrolyte drinks (ex. Gatorade, Powerade, Pedialyte) are also encouraged. You should be drinking enough fluids to make your urine light yellow, almost clear. If this is not the case, you are not drinking enough water. Please note that some of the treatments indicated below will not be effective if you are not adequately hydrated. Diet: Please concentrate on hydration, however, you may introduce food slowly.  Start with a clear liquid diet, progressed to a full liquid diet, and then bland solids as you are able. Pain or fever: Ibuprofen, Naproxen, or acetaminophen (generic for Tylenol) for pain or fever.  Antiinflammatory medications: Take 600 mg of ibuprofen every 6 hours or 440 mg (over the counter dose) to 500 mg (prescription dose) of naproxen every 12 hours for the next 3 days. After this time, these medications may be used as needed for pain. Take these medications with food to avoid upset stomach. Choose only one of these medications, do not take them together. Acetaminophen (generic for Tylenol): Should you continue to have additional pain while taking the ibuprofen or naproxen, you may add in acetaminophen as needed. Your daily total maximum amount of acetaminophen from all sources  should be limited to 4000mg /day for persons without liver problems, or 2000mg /day for those with liver problems. Diarrhea: May use medications such as loperamide (Imodium) or Bismuth subsalicylate (Pepto-Bismol). Cough: Use the benzonatate (generic for Tessalon) for cough.  Teas, warm liquids, broths, and honey can also help with cough. Albuterol: May use the albuterol as needed for instances of shortness of breath. Prednisone: Take the prednisone, as directed, in its entirety. Zyrtec or Claritin: May add these medication daily to control underlying symptoms of congestion, sneezing, and other signs of allergies.  These medications are available over-the-counter. Generics: Cetirizine (generic for Zyrtec) and loratadine (generic for Claritin). Fluticasone: Use fluticasone (generic for Flonase), as directed, for nasal and sinus congestion.  This medication is available over-the-counter. Congestion: Plain guaifenesin (generic for plain Mucinex) may help relieve congestion. Saline sinus rinses and saline nasal sprays may also help relieve congestion. If you do not have high blood pressure, heart problems, or an allergy to such medications, you may also try phenylephrine or Sudafed. Sore throat: Warm liquids or Chloraseptic spray may help soothe a sore throat. Gargle twice a day with a salt water solution made from a half teaspoon of salt in a cup of warm water.  Follow up: Follow up with a primary care provider within the next two weeks should symptoms fail to resolve. Return: Return to the ED for significantly worsening symptoms, shortness of breath, persistent vomiting, large amounts of blood in stool, or any other major concerns.  For prescription assistance, may try using prescription discount sites or apps, such as goodrx.com  Test Results for COVID-19 pending  You have a test pending for  COVID-19.  Results typically return within about 48 hours.  Be sure to check MyChart for updated results.  We  recommend isolating yourself until results are received.  Patients who have symptoms consistent with COVID-19 should self isolated for: At least 3 days (72 hours) have passed since recovery, defined as resolution of fever without the use of fever reducing medications and improvement in respiratory symptoms (e.g., cough, shortness of breath), and At least 7 days have passed since symptoms first appeared.  If you have no symptoms, but your test returns positive, recommend isolating for at least 10 days.

## 2019-05-31 LAB — SARS CORONAVIRUS 2 (TAT 6-24 HRS): SARS Coronavirus 2: NEGATIVE

## 2020-10-15 ENCOUNTER — Ambulatory Visit (INDEPENDENT_AMBULATORY_CARE_PROVIDER_SITE_OTHER): Payer: Self-pay | Admitting: Nurse Practitioner

## 2020-10-15 ENCOUNTER — Encounter: Payer: Self-pay | Admitting: Nurse Practitioner

## 2020-10-15 ENCOUNTER — Telehealth: Payer: Self-pay

## 2020-10-15 ENCOUNTER — Other Ambulatory Visit: Payer: Self-pay

## 2020-10-15 VITALS — BP 120/77 | HR 62 | Ht 74.5 in | Wt 267.0 lb

## 2020-10-15 DIAGNOSIS — Z7251 High risk heterosexual behavior: Secondary | ICD-10-CM

## 2020-10-15 DIAGNOSIS — Z7689 Persons encountering health services in other specified circumstances: Secondary | ICD-10-CM

## 2020-10-15 DIAGNOSIS — R300 Dysuria: Secondary | ICD-10-CM

## 2020-10-15 DIAGNOSIS — F319 Bipolar disorder, unspecified: Secondary | ICD-10-CM

## 2020-10-15 DIAGNOSIS — Z139 Encounter for screening, unspecified: Secondary | ICD-10-CM

## 2020-10-15 MED ORDER — CARIPRAZINE HCL 1.5 MG PO CAPS: 1.5000 mg | ORAL_CAPSULE | Freq: Every day | ORAL | 0 refills | Status: AC

## 2020-10-15 NOTE — Telephone Encounter (Signed)
error 

## 2020-10-15 NOTE — Assessment & Plan Note (Signed)
-  he has been separated from his wife, and he would like to get an STD test before getting back together with her or being with someone else -will check STD panel

## 2020-10-15 NOTE — Assessment & Plan Note (Signed)
-  check STD panel

## 2020-10-15 NOTE — Patient Instructions (Signed)
Please leave have blood and urine collected at the labs this morning.  We will check additional blood work prior to your physical. Please have fasting labs drawn 2-3 days prior to your appointment so we can discuss the results during your office visit.

## 2020-10-15 NOTE — Progress Notes (Signed)
New Patient Office Visit  Subjective:  Patient ID: Lawrence Whitaker, male    DOB: February 27, 1980  Age: 40 y.o. MRN: [id number]  CC:  Chief Complaint  Patient presents with   New Patient (Initial Visit)    Here to establish care. Wants to get checked for all STD's today, has some discomfort, no discharge or pain.     HPI Lawrence Whitaker presents for new patient visit. Transferring care from Last physical Last labs  He would like to be checked for STDs. He states he feels a "tingle" when he urinates. Denies discharge. He states he was separated with his wife, but doesn't want to do anything without his wife without a check.  He states he was on "bipolar medication" previously, and it caused erectile dysfunction.  He states he took haldol, tegretol, cogentin, and trazodone. He stopped taking that medicine several years ago.  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   Past Medical History:  Diagnosis Date   Bipolar 1 disorder ([place])    Depression    GERD (gastroesophageal reflux disease)    Lactose intolerance    he states this started after tylenol PM overdose    Past Surgical History:  Procedure Laterality Date   HIP SURGERY Bilateral 1996 or 1997   slipped epiphysis    History reviewed. No pertinent family history.  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  Tobacco Use   Smoking status: Every Day    Packs/day: 1.00    Years: 18.00    Pack years: 18.00    Types: Cigarettes   Smokeless tobacco: Never  Vaping Use   Vaping Use: Never used  Substance and Sexual Activity   Alcohol use: Yes    Comment: occasionally- on weekends he may drink a case of 18-24 beers   Drug use: Yes    Types: Marijuana    Comment:  daily smoker- states it keeps him calm, increases appetite and helps him sleep   Sexual activity: Yes    Birth control/protection: None  Other Topics Concern   Not on file  Social History Narrative   2 boys, aged 93 and 10 as of 10/15/20   Social Determinants of Health   Financial Resource Strain: Not on file  Food Insecurity: Not on file  Transportation Needs: Not on file  Physical Activity: Not on file  Stress: Not on file  Social Connections: Not on file  Intimate Partner Violence: Not on file    ROS Review of Systems  Constitutional: Negative.   Respiratory: Negative.    Cardiovascular: Negative.   Psychiatric/Behavioral:         Bipolar disorder; he states he has large mood swings and goes from depressed to manic   Objective:   Today's Vitals: BP 120/77 (BP Location: Left Arm, Patient Position: Sitting, Cuff Size: Large)   Pulse 62   Ht 6' 2.5" (1.892 m)   Wt 267 lb (121.1 kg)   SpO2 97%   BMI 33.82 kg/m   Physical Exam Constitutional:      Appearance: Normal appearance.  Cardiovascular:     Rate and Rhythm: Normal rate and regular rhythm.     Pulses: Normal pulses.  Heart sounds: Normal heart sounds.  Pulmonary:     Effort: Pulmonary effort is normal.     Breath sounds: Normal breath sounds.  Neurological:     Mental Status: He is alert.  Psychiatric:     Comments: Positive screening for bipolar per MDQ    Assessment & Plan:   Problem List Items Addressed This Visit       Other   Establishing care with new doctor, encounter for    -obtain records      Relevant Orders   CBC with Differential/Platelet   CMP14+EGFR   Lipid Panel With LDL/HDL Ratio   TSH   Screening due    -will screen for HCV with next set of labs      Relevant Orders   Hepatitis C antibody   HIV Antibody (routine testing w rflx)   Dysuria - Primary    -he has been separated from his wife, and he would like to get an STD test before getting back together with her or  being with someone else -will check STD panel      Relevant Orders   Chlamydia/Gonococcus/Trichomonas, NAA   Urinalysis, dipstick only   High risk sexual behavior    -check STD panel      Relevant Orders   Hepatitis C antibody   HIV Antibody (routine testing w rflx)   RPR   Bipolar depression ([place])    -positive screening on MDQ with 8 positive responses; caused minor problems -Rx. vraylar -we discussed that if he had suicidal thought to notify office or seek help at the ED -may consider future psych consult since he had hx of suicide attempt in 2013 if he isn't successful with vraylar      Relevant Medications   cariprazine (VRAYLAR) 1.5 MG capsule    Outpatient Encounter Medications as of 10/15/2020  Medication Sig   albuterol (VENTOLIN HFA) 108 (90 Base) MCG/ACT inhaler Inhale 1-2 puffs into the lungs every 6 (six) hours as needed for wheezing or shortness of breath.   cariprazine (VRAYLAR) 1.5 MG capsule Take 1 capsule (1.5 mg total) by mouth daily.   [DISCONTINUED] amoxicillin-clavulanate (AUGMENTIN) 875-125 MG tablet Take 1 tablet by mouth 2 (two) times daily.   [DISCONTINUED] benzonatate (TESSALON) 100 MG capsule Take 1 capsule (100 mg total) by mouth every 8 (eight) hours.   [DISCONTINUED] cyclobenzaprine (FLEXERIL) 10 MG tablet Take 1 tablet (10 mg total) by mouth 2 (two) times daily as needed for muscle spasms. (Patient not taking: Reported on 10/15/2020)   [DISCONTINUED] fluticasone (FLONASE) 50 MCG/ACT nasal spray Place 2 sprays into both nostrils daily. (Patient not taking: Reported on 10/15/2020)   [DISCONTINUED] HYDROcodone-acetaminophen (NORCO/VICODIN) 5-325 MG tablet Take 1 tablet by mouth every 6 (six) hours as needed. for pain   [DISCONTINUED] ibuprofen (ADVIL) 400 MG tablet Take 1 tablet (400 mg total) by mouth every 6 (six) hours as needed. (Patient not taking: Reported on 10/15/2020)   [DISCONTINUED] predniSONE (STERAPRED UNI-PAK 21 TAB) 10 MG (21) TBPK tablet  Take 6 tabs (29m) day 1, 5 tabs (523m day 2, 4 tabs (4086mday 3, 3 tabs (23m93may 4, 2 tabs (20mg70my 5, and 1 tab (10mg)42m 6.   No facility-administered encounter medications on file as of 10/15/2020.    Follow-up: Return in about 6 weeks (around 11/26/2020) for Physical Exam.   JOSEPHNoreene Larsson

## 2020-10-15 NOTE — Assessment & Plan Note (Addendum)
-  positive screening on MDQ with 8 positive responses; caused minor problems -Rx. vraylar -we discussed that if he had suicidal thought to notify office or seek help at the ED -may consider future psych consult since he had hx of suicide attempt in 2013 if he isn't successful with vraylar

## 2020-10-15 NOTE — Assessment & Plan Note (Signed)
-  will screen for HCV with next set of labs 

## 2020-10-15 NOTE — Assessment & Plan Note (Signed)
-  obtain records 

## 2020-10-16 LAB — RPR: RPR Ser Ql: NONREACTIVE

## 2020-10-16 LAB — HIV ANTIBODY (ROUTINE TESTING W REFLEX): HIV Screen 4th Generation wRfx: NONREACTIVE

## 2020-10-16 LAB — HEPATITIS C ANTIBODY: Hep C Virus Ab: <0.1 s/co ratio (ref 0.0–0.9)

## 2020-10-16 NOTE — Progress Notes (Signed)
Hep C, HIV, and syphilis are negative.

## 2020-10-18 NOTE — Progress Notes (Signed)
STD panel is negative. There is one lab that is still pending, and we will call if it is positive. Otherwise, you can assume it is negative.

## 2020-10-19 LAB — CHLAMYDIA/GONOCOCCUS/TRICHOMONAS, NAA
Chlamydia by NAA: NEGATIVE
Gonococcus by NAA: NEGATIVE
Trich vag by NAA: NEGATIVE

## 2020-10-22 ENCOUNTER — Telehealth: Payer: Self-pay

## 2020-10-22 NOTE — Telephone Encounter (Signed)
Patient returning call about lab results.  

## 2020-11-29 ENCOUNTER — Encounter: Payer: Self-pay | Admitting: Nurse Practitioner

## 2021-02-07 ENCOUNTER — Encounter: Payer: Self-pay | Admitting: Nurse Practitioner

## 2021-03-02 ENCOUNTER — Other Ambulatory Visit: Payer: Self-pay

## 2021-03-02 ENCOUNTER — Emergency Department (HOSPITAL_COMMUNITY)
Admission: EM | Admit: 2021-03-02 | Discharge: 2021-03-03 | Disposition: A | Discharge status: Home / Self Care | Payer: No Typology Code available for payment source | Attending: Emergency Medicine | Admitting: Emergency Medicine

## 2021-03-02 ENCOUNTER — Encounter (HOSPITAL_COMMUNITY): Payer: Self-pay

## 2021-03-02 ENCOUNTER — Emergency Department (HOSPITAL_COMMUNITY): Payer: No Typology Code available for payment source

## 2021-03-02 DIAGNOSIS — S32049A Unspecified fracture of fourth lumbar vertebra, initial encounter for closed fracture: Secondary | ICD-10-CM | POA: Diagnosis not present

## 2021-03-02 DIAGNOSIS — R040 Epistaxis: Secondary | ICD-10-CM | POA: Diagnosis not present

## 2021-03-02 DIAGNOSIS — S3991XA Unspecified injury of abdomen, initial encounter: Secondary | ICD-10-CM | POA: Diagnosis not present

## 2021-03-02 DIAGNOSIS — Y92410 Unspecified street and highway as the place of occurrence of the external cause: Secondary | ICD-10-CM | POA: Diagnosis not present

## 2021-03-02 DIAGNOSIS — S2242XA Multiple fractures of ribs, left side, initial encounter for closed fracture: Secondary | ICD-10-CM | POA: Diagnosis not present

## 2021-03-02 DIAGNOSIS — S01512A Laceration without foreign body of oral cavity, initial encounter: Secondary | ICD-10-CM | POA: Insufficient documentation

## 2021-03-02 DIAGNOSIS — S299XXA Unspecified injury of thorax, initial encounter: Secondary | ICD-10-CM | POA: Diagnosis present

## 2021-03-02 DIAGNOSIS — S0990XA Unspecified injury of head, initial encounter: Secondary | ICD-10-CM | POA: Diagnosis not present

## 2021-03-02 DIAGNOSIS — S32009A Unspecified fracture of unspecified lumbar vertebra, initial encounter for closed fracture: Secondary | ICD-10-CM

## 2021-03-02 DIAGNOSIS — S2249XA Multiple fractures of ribs, unspecified side, initial encounter for closed fracture: Secondary | ICD-10-CM

## 2021-03-02 LAB — URINALYSIS, ROUTINE W REFLEX MICROSCOPIC
Bilirubin Urine: NEGATIVE
Glucose, UA: NEGATIVE mg/dL
Ketones, ur: NEGATIVE mg/dL
Leukocytes,Ua: NEGATIVE
Nitrite: NEGATIVE
Protein, ur: NEGATIVE mg/dL
Specific Gravity, Urine: >1.03 — ABNORMAL HIGH (ref 1.005–1.030)
pH: 6 (ref 5.0–8.0)

## 2021-03-02 LAB — COMPREHENSIVE METABOLIC PANEL
ALT: 31 U/L (ref 0–44)
AST: 58 U/L — ABNORMAL HIGH (ref 15–41)
Albumin: 3.8 g/dL (ref 3.5–5.0)
Alkaline Phosphatase: 71 U/L (ref 38–126)
Anion gap: 7 (ref 5–15)
BUN: 15 mg/dL (ref 6–20)
CO2: 24 mmol/L (ref 22–32)
Calcium: 8.8 mg/dL — ABNORMAL LOW (ref 8.9–10.3)
Chloride: 107 mmol/L (ref 98–111)
Creatinine, Ser: 0.96 mg/dL (ref 0.61–1.24)
GFR, Estimated: >60 mL/min (ref >60)
Glucose, Bld: 108 mg/dL — ABNORMAL HIGH (ref 70–99)
Potassium: 3.8 mmol/L (ref 3.5–5.1)
Sodium: 138 mmol/L (ref 135–145)
Total Bilirubin: 0.5 mg/dL (ref 0.3–1.2)
Total Protein: 7 g/dL (ref 6.5–8.1)

## 2021-03-02 LAB — CBC WITH DIFFERENTIAL/PLATELET
Abs Immature Granulocytes: 0.02 10*3/uL (ref 0.00–0.07)
Basophils Absolute: 0 10*3/uL (ref 0.0–0.1)
Basophils Relative: 0 %
Eosinophils Absolute: 0.1 10*3/uL (ref 0.0–0.5)
Eosinophils Relative: 2 %
HCT: 42 % (ref 39.0–52.0)
Hemoglobin: 13.5 g/dL (ref 13.0–17.0)
Immature Granulocytes: 0 %
Lymphocytes Relative: 21 %
Lymphs Abs: 1.5 10*3/uL (ref 0.7–4.0)
MCH: 30.7 pg (ref 26.0–34.0)
MCHC: 32.1 g/dL (ref 30.0–36.0)
MCV: 95.5 fL (ref 80.0–100.0)
Monocytes Absolute: 0.9 10*3/uL (ref 0.1–1.0)
Monocytes Relative: 13 %
Neutro Abs: 4.6 10*3/uL (ref 1.7–7.7)
Neutrophils Relative %: 64 %
Platelets: 263 10*3/uL (ref 150–400)
RBC: 4.4 MIL/uL (ref 4.22–5.81)
RDW: 13.8 % (ref 11.5–15.5)
WBC: 7.1 10*3/uL (ref 4.0–10.5)
nRBC: 0 % (ref 0.0–0.2)

## 2021-03-02 LAB — URINALYSIS, MICROSCOPIC (REFLEX)

## 2021-03-02 MED ORDER — IOHEXOL 300 MG/ML  SOLN
100.0000 mL | Freq: Once | INTRAMUSCULAR | Status: AC | PRN
  Administered 2021-03-02: 100 mL via INTRAVENOUS

## 2021-03-02 MED ORDER — HYDROMORPHONE HCL 1 MG/ML IJ SOLN
1.0000 mg | Freq: Once | INTRAMUSCULAR | Status: AC
  Administered 2021-03-02: 1 mg via INTRAVENOUS
  Filled 2021-03-02: qty 1

## 2021-03-02 MED ORDER — HYDROMORPHONE HCL 1 MG/ML IJ SOLN
0.5000 mg | Freq: Once | INTRAMUSCULAR | Status: AC
  Administered 2021-03-02: 0.5 mg via INTRAVENOUS
  Filled 2021-03-02: qty 1

## 2021-03-02 MED ORDER — ACETAMINOPHEN 500 MG PO TABS
1000.0000 mg | ORAL_TABLET | Freq: Once | ORAL | Status: AC
  Administered 2021-03-02: 1000 mg via ORAL
  Filled 2021-03-02: qty 2

## 2021-03-02 NOTE — ED Notes (Signed)
Patient transported to CT 
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
## 2021-03-02 NOTE — ED Notes (Signed)
Pt encouraged to provide urine sample per MD order  
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
## 2021-03-03 ENCOUNTER — Encounter: Payer: Self-pay | Admitting: Nurse Practitioner

## 2021-03-03 MED ORDER — NAPROXEN 500 MG PO TABS: 500.0000 mg | ORAL_TABLET | Freq: Two times a day (BID) | ORAL | 0 refills | Status: DC

## 2021-03-03 MED ORDER — OXYCODONE HCL 5 MG PO TABS
5.0000 mg | ORAL_TABLET | ORAL | 0 refills | Status: DC | PRN
Start: 2021-03-03 — End: 2021-03-07
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
## 2021-03-07 ENCOUNTER — Other Ambulatory Visit: Payer: Self-pay

## 2021-03-07 ENCOUNTER — Ambulatory Visit (INDEPENDENT_AMBULATORY_CARE_PROVIDER_SITE_OTHER): Payer: Self-pay | Admitting: Internal Medicine

## 2021-03-07 ENCOUNTER — Encounter: Payer: Self-pay | Admitting: Internal Medicine

## 2021-03-07 VITALS — BP 148/88 | HR 74 | Ht 74.0 in | Wt 259.1 lb

## 2021-03-07 DIAGNOSIS — S32039D Unspecified fracture of third lumbar vertebra, subsequent encounter for fracture with routine healing: Secondary | ICD-10-CM | POA: Insufficient documentation

## 2021-03-07 DIAGNOSIS — S2249XD Multiple fractures of ribs, unspecified side, subsequent encounter for fracture with routine healing: Secondary | ICD-10-CM

## 2021-03-07 DIAGNOSIS — S32038D Other fracture of third lumbar vertebra, subsequent encounter for fracture with routine healing: Secondary | ICD-10-CM

## 2021-03-07 DIAGNOSIS — Z09 Encounter for follow-up examination after completed treatment for conditions other than malignant neoplasm: Secondary | ICD-10-CM

## 2021-03-07 MED ORDER — OXYCODONE-ACETAMINOPHEN 5-325 MG PO TABS: 1.0000 | ORAL_TABLET | Freq: Three times a day (TID) | ORAL | 0 refills | Status: AC | PRN

## 2021-03-07 MED ORDER — NAPROXEN 500 MG PO TABS: 500.0000 mg | ORAL_TABLET | Freq: Two times a day (BID) | ORAL | 1 refills | Status: AC

## 2021-03-07 MED ORDER — KETOROLAC TROMETHAMINE 60 MG/2ML IM SOLN
60.0000 mg | Freq: Once | INTRAMUSCULAR | Status: AC
  Administered 2021-03-07: 60 mg via INTRAMUSCULAR
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
## 2021-03-07 NOTE — Assessment & Plan Note (Signed)
Noted on CT scan of the lumbar spine Continue naproxen and Percocet for now Advised to contact if any new symptoms like neurologic deficits (numbness, tingling or weakness of the LE)

## 2021-03-07 NOTE — Patient Instructions (Signed)
Please take Naproxen as prescribed for mild-moderate pain.  Please take Percocet only for severe pain (8-10/10).  Please avoid heavy lifting and frequent bending.  Please perform deep breathing exercises at least 3 times in a day.
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
## 2021-03-07 NOTE — Assessment & Plan Note (Signed)
CT chest: Fractures of the posterior eighth through eleventh ribs with associated left basilar atelectasis. No other acute abnormality of the chest, abdomen or pelvis.  Has severe left-sided rib cage pain along with back pain Currently controlled with naproxen and oxycodone Refilled naproxen and started Percocet for better pain control Advised to perform deep breathing exercises as tolerated to improve atelectasis noticed on CT chest

## 2021-04-21 ENCOUNTER — Telehealth: Payer: Self-pay | Admitting: Nurse Practitioner

## 2021-04-21 NOTE — Telephone Encounter (Signed)
Patient LVM for a call back. Called patient back with no answer. ?

## 2021-04-24 ENCOUNTER — Encounter: Payer: Self-pay | Admitting: Nurse Practitioner

## 2021-04-24 ENCOUNTER — Ambulatory Visit (INDEPENDENT_AMBULATORY_CARE_PROVIDER_SITE_OTHER): Payer: 59 | Admitting: Nurse Practitioner

## 2021-04-24 ENCOUNTER — Other Ambulatory Visit: Payer: Self-pay

## 2021-04-24 VITALS — BP 140/82 | HR 92 | Ht 74.0 in | Wt 275.0 lb

## 2021-04-24 DIAGNOSIS — R030 Elevated blood-pressure reading, without diagnosis of hypertension: Secondary | ICD-10-CM | POA: Diagnosis not present

## 2021-04-24 DIAGNOSIS — Z139 Encounter for screening, unspecified: Secondary | ICD-10-CM

## 2021-04-24 DIAGNOSIS — F1211 Cannabis abuse, in remission: Secondary | ICD-10-CM | POA: Diagnosis not present

## 2021-04-24 DIAGNOSIS — E669 Obesity, unspecified: Secondary | ICD-10-CM

## 2021-04-24 DIAGNOSIS — Z2821 Immunization not carried out because of patient refusal: Secondary | ICD-10-CM | POA: Insufficient documentation

## 2021-04-24 DIAGNOSIS — F17200 Nicotine dependence, unspecified, uncomplicated: Secondary | ICD-10-CM

## 2021-04-24 NOTE — Assessment & Plan Note (Signed)
Patient states that he has quit taking marijuana ?He stated that he needs a urine drug test for a substance abuse class that he is attending ?Urine drug test ordered today. ?

## 2021-04-24 NOTE — Assessment & Plan Note (Signed)
Wt Readings from Last 3 Encounters:  ?04/24/21 275 lb (124.7 kg)  ?03/07/21 259 lb 1.9 oz (117.5 kg)  ?03/02/21 265 lb (120.2 kg)  ?Need to increase intake of home foods including plenty of vegetables and protein less carbohydrates discussed with patient.  Patient encouraged to exercise at least 30 minutes daily 5 to 7 days a week to help maintain healthy weight. ?[facility] to to his work schedule.  ?

## 2021-04-24 NOTE — Patient Instructions (Signed)
Please get your fasting labs done 3-5 days before your next visit. ° ° °It is important that you exercise regularly at least 30 minutes 5 times a week.  °Think about what you will eat, plan ahead. °Choose " clean, green, fresh or frozen" over canned, processed or packaged foods which are more sugary, salty and fatty. °70 to 75% of food eaten should be vegetables and fruit. °Three meals at set times with snacks allowed between meals, but they must be fruit or vegetables. °Aim to eat over a 12 hour period , example 7 am to 7 pm, and STOP after  your last meal of the day. °Drink water,generally about 64 ounces per day, no other drink is as healthy. Fruit juice is best enjoyed in a healthy way, by EATING the fruit. ° °Thanks for choosing [facility], we consider it a privelige to serve you.  °

## 2021-04-24 NOTE — Assessment & Plan Note (Addendum)
Patient smokes 1 pack of cigarettes daily, states that he used to smoke 2 packs daily.  Patient states that he is working on cutting back on smoking.  Need to quit smoking including risk of lung disease lung cancer discussed with patient he verbalized understanding.  Follow-up in 3 months ?

## 2021-04-24 NOTE — Assessment & Plan Note (Signed)
BP Readings from Last 3 Encounters:  ?04/24/21 140/82  ?03/07/21 (!) 148/88  ?03/03/21 119/77  ?No diagnosis of hypertension ?Dash diet advised, patient encouraged to exercise 30 minutes daily 5 times a week he verbalized understanding ?Follow-up in 3 months ?If blood pressure is still elevated at next visit will discuss the need to start medication. ?

## 2021-04-24 NOTE — Progress Notes (Signed)
? ?  Lawrence Whitaker     MRN: [id number]      DOB: 1980/09/08 ? ? ?HPI ?Lawrence Whitaker with medical history of bipolar depression major depressive disorder is here for a 10 panel urinalysis, states that he needs it for a substance abuse professional class. He attended substance abuse class and finished about a month ago, pt stated that he used marijuana in the past  , denies use of cocaine, amphetamine.  ? ?Smokes a pack of cigarettes daily , he used to smoke 2 packs a day but cut down to one, not ready to quit today.  ? ? ?ROS ?Denies recent fever or chills. ?Denies sinus pressure, nasal congestion, ear pain or sore throat. ?Denies chest congestion, productive cough or wheezing. ?Denies chest pains, palpitations and leg swelling ?Denies abdominal pain, nausea, vomiting,diarrhea or constipation.   ?Denies dysuria, frequency, hesitancy or incontinence. ?Denies depression, anxiety or insomnia. ?. ? ? ?PE ? ?BP 140/82 (BP Location: Right Arm, Cuff Size: Large)   Pulse 92   Ht 6\' 2"  (1.88 m)   Wt 275 lb (124.7 kg)   SpO2 98%   BMI 35.31 kg/m?  ? ?Patient alert and oriented and in no cardiopulmonary distress. ? ? ?Chest: Clear to auscultation bilaterally. ? ?CVS: S1, S2 no murmurs, no S3.Regular rate. ? ?ABD: Soft non tender.  ? ?Ext: No edema ? ?MS: Adequate ROM spine, shoulders, hips and knees. ? ?Skin: Intact, no ulcerations or rash noted. ? ?Psych: Good eye contact, normal affect. Memory intact not anxious or depressed appearing. ? ? ? ? ?Assessment & Plan ? ?Elevated blood pressure reading ?BP Readings from Last 3 Encounters:  ?04/24/21 140/82  ?03/07/21 (!) 148/88  ?03/03/21 119/77  ?No diagnosis of hypertension ?Dash diet advised, patient encouraged to exercise 30 minutes daily 5 times a week he verbalized understanding ?Follow-up in 3 months ?If blood pressure is still elevated at next visit will discuss the need to start medication. ? ?Obesity (BMI 30-39.9) ?Wt Readings from Last 3 Encounters:  ?04/24/21 275 lb  (124.7 kg)  ?03/07/21 259 lb 1.9 oz (117.5 kg)  ?03/02/21 265 lb (120.2 kg)  ?Need to increase intake of home foods including plenty of vegetables and protein less carbohydrates discussed with patient.  Patient encouraged to exercise at least 30 minutes daily 5 to 7 days a week to help maintain healthy weight. ?[facility] due to to his work schedule.  ? ?Tobacco use disorder ?Patient smokes 1 pack of cigarettes daily, states that he used to smoke 2 packs daily.  Patient states that he is working on cutting back on smoking.  Need to quit smoking including risk of lung disease lung cancer discussed with patient he verbalized understanding.  Follow-up in 3 monthsrr ? ?Marijuana abuse in remission ?Patient states that he has quit taking marijuana ?He stated that he needs a urine drug test for a substance abuse class that he is attending ?Urine drug test ordered today.  ?

## 2021-05-01 ENCOUNTER — Encounter: Payer: Self-pay | Admitting: Nurse Practitioner

## 2021-05-01 LAB — TOXASSURE SELECT 13 (MW), URINE

## 2021-05-01 NOTE — Progress Notes (Signed)
Drugs absent. Please discuss result with pt, we can print out the result for him if he would like that, thanks

## 2021-05-01 NOTE — Telephone Encounter (Signed)
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

## 2021-05-09 ENCOUNTER — Ambulatory Visit: Payer: 59 | Admitting: Nurse Practitioner

## 2021-05-09 ENCOUNTER — Telehealth: Payer: Self-pay

## 2021-05-09 NOTE — Telephone Encounter (Signed)
Please call patient to discuss urine results ?

## 2021-05-09 NOTE — Telephone Encounter (Signed)
Spoke with pt, he just needed lab work printed. Printed lab and placed up front he will come pick up this morning ?

## 2021-05-14 NOTE — Telephone Encounter (Signed)
Patient called needs the 13 panel of what he was tested for printed out on a piece of paper in detailed. Will pick up at front desk. ?

## 2021-05-15 ENCOUNTER — Telehealth: Payer: Self-pay | Admitting: Nurse Practitioner

## 2021-05-15 NOTE — Telephone Encounter (Signed)
Patient called in regard to 13 panel lab results,. ? ?Patient has received 2 copies of lab results but is stating that class will not accept the results. ? ?The class is looking to have each item tested for listed out with negative or positive result. ? ?Patient would like a call back in regard. ?

## 2021-05-16 NOTE — Telephone Encounter (Signed)
Please advise, pt is stating that the lab results we have for him is not what he needs he needs a full break down stating that each drug screened and he has a negative result ?

## 2021-05-20 NOTE — Telephone Encounter (Signed)
Got lab to print off detail report that shows what he was screened for called pt his wife crystal will come pick up ?

## 2021-06-24 ENCOUNTER — Ambulatory Visit: Payer: 59 | Admitting: Nurse Practitioner

## 2021-06-27 ENCOUNTER — Encounter: Payer: Self-pay | Admitting: Nurse Practitioner

## 2021-06-27 ENCOUNTER — Ambulatory Visit (INDEPENDENT_AMBULATORY_CARE_PROVIDER_SITE_OTHER): Payer: 59 | Admitting: Nurse Practitioner

## 2021-06-27 VITALS — BP 132/88 | HR 100 | Ht 74.0 in | Wt 270.1 lb

## 2021-06-27 DIAGNOSIS — Z7251 High risk heterosexual behavior: Secondary | ICD-10-CM | POA: Diagnosis not present

## 2021-06-27 DIAGNOSIS — F319 Bipolar disorder, unspecified: Secondary | ICD-10-CM | POA: Diagnosis not present

## 2021-06-27 DIAGNOSIS — F17200 Nicotine dependence, unspecified, uncomplicated: Secondary | ICD-10-CM | POA: Diagnosis not present

## 2021-06-27 DIAGNOSIS — R030 Elevated blood-pressure reading, without diagnosis of hypertension: Secondary | ICD-10-CM

## 2021-06-27 NOTE — Progress Notes (Signed)
? ?  Lawrence Whitaker     MRN: [id number]      DOB: October 07, 1980 ? ? ?HPI ?Lawrence Whitaker with past medical history of bipolar depression, tobacco use disorder, obesity, high risk sexual behavior is here for STD testing and would like to start back on bipolar medication. ? ?Pt is requesting for medications for STD. He got tested for STD sometimes in the past but it was negative, his wife presently has Trichomonas infection. Patient denies penile discharge, dysuria , scrotal pain , fever , chills, malaise, says that he has only one sexual partner.  ? ? 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  ? ? ? ? ? ? ?ROS ?Denies recent fever or chills. ?Denies sinus pressure, nasal congestion, ear pain or sore throat. ?Denies chest congestion, productive cough or wheezing. ?Denies chest pains, palpitations and leg swelling ?Denies abdominal pain, nausea, vomiting,diarrhea or constipation.   ?Denies dysuria, frequency, hesitancy or incontinence. ?Denies joint pain, swelling and limitation in mobility. ?Denies headaches, seizures, numbness, or tingling. ?Denies depression, anxiety or insomnia. ?Denies skin break down or rash. ? ? ?PE ? ?BP (!) 142/82 (BP Location: Right Arm, Cuff Size: Large)   Pulse 100   Ht 6\' 2"  (1.88 m)   Wt 270 lb 1.9 oz (122.5 kg)   SpO2 94%   BMI 34.68 kg/m?  ? ?Patient alert and oriented and in no cardiopulmonary distress. ? ?Chest: Clear to auscultation bilaterally. ? ?CVS: S1, S2 no murmurs, no S3.Regular rate. ? ?ABD: Soft non tender.  ? ?Ext: No edema ? ?MS: Adequate ROM spine, shoulders, hips and knees. ? ?Psych: Good eye contact, normal affect. Memory intact not anxious or depressed  appearing. ? ? ? ? ?Assessment & Plan ?High risk sexual behavior ?His wife currently has trichomonas infection ?STD test ordered today ? ?Bipolar depression (HCC) ? he has tried cogentin, Haldol tegretol, trazodone , stopped meds Due to erectile dysfunction denies SI, HI.  Does not remember taking Vraylar ?Patient referred to [facility] for referral to psych ?Patient denies SI, HI. ? ?Elevated blood pressure reading ? ?BP Readings from Last 3 Encounters:  ?06/27/21 132/88  ?04/24/21 140/82  ?03/07/21 (!) 148/88  ?Currently not on medication ?DASH diet advised patient encouraged to exercise at least 150 minutes weekly. ?Counseled on smoking cessation. ? ?Tobacco use disorder ?Continues to smoke 1 pack of cigarettes daily, need to quit smoking including risk of lung cancer, COPD, heart attack discussed with patient he verbalized understanding smoking cessation education materials given to patient in the office today.  Patient will continue to work on cutting back with a goal of quitting.  ? ?

## 2021-06-27 NOTE — Assessment & Plan Note (Signed)
His wife currently has trichomonas infection ?STD test ordered today ?

## 2021-06-27 NOTE — Patient Instructions (Signed)

## 2021-06-27 NOTE — Assessment & Plan Note (Signed)
he has tried cogentin, Haldol tegretol, trazodone , stopped meds Due to erectile dysfunction denies SI, HI.  Does not remember taking Vraylar ?Patient referred to [facility] for referral to psych ?Patient denies SI, HI. ?

## 2021-06-27 NOTE — Assessment & Plan Note (Signed)
Continues to smoke 1 pack of cigarettes daily, need to quit smoking including risk of lung cancer, COPD, heart attack discussed with patient he verbalized understanding smoking cessation education materials given to patient in the office today.  Patient will continue to work on cutting back with a goal of quitting. ?

## 2021-06-27 NOTE — Assessment & Plan Note (Signed)
?  BP Readings from Last 3 Encounters:  ?06/27/21 132/88  ?04/24/21 140/82  ?03/07/21 (!) 148/88  ?Currently not on medication ?DASH diet advised patient encouraged to exercise at least 150 minutes weekly. ?Counseled on smoking cessation. ?

## 2021-07-01 ENCOUNTER — Telehealth: Payer: Self-pay | Admitting: *Deleted

## 2021-07-01 NOTE — Chronic Care Management (AMB) (Signed)
  Care Management   Outreach Note  07/01/2021 Name: Lawrence Whitaker MRN: [id number] DOB: September 14, 1980  Referred by: Donell Beers, FNP Reason for referral : Care Coordination (Initial outreach to schedule referral with Licensed Clinical SW)   An unsuccessful telephone outreach was attempted today. The patient was referred to the case management team for assistance with care management and care coordination.   Follow Up Plan:  The care management team will reach out to the patient again over the next 3-5 days.  If patient returns call to provider office, please advise to call Embedded Care Management Care Guide Misty Stanley* at [phone].Gwenevere Ghazi  Care Guide, Embedded Care Coordination [facility] Management  Direct Dial: [phone]

## 2021-07-03 NOTE — Chronic Care Management (AMB) (Signed)
  Care Management   Outreach Note  07/03/2021 Name: Rodel Glaspy MRN: [id number] DOB: 1980-12-06  Referred by: Donell Beers, FNP Reason for referral : Care Coordination (Initial outreach to schedule referral with Licensed Clinical SW)   A second unsuccessful telephone outreach was attempted today. The patient was referred to the case management team for assistance with care management and care coordination.   Follow Up Plan:  A HIPAA compliant phone message was left for the patient providing contact information and requesting a return call.  The care management team will reach out to the patient again over the next 7 days.  If patient returns call to provider office, please advise to call Embedded Care Management Care Guide Misty Stanley* at [phone].Gwenevere Ghazi  Care Guide, Embedded Care Coordination [facility] Management  Direct Dial: [phone]

## 2021-07-04 ENCOUNTER — Other Ambulatory Visit: Payer: Self-pay | Admitting: Nurse Practitioner

## 2021-07-04 DIAGNOSIS — A493 Mycoplasma infection, unspecified site: Secondary | ICD-10-CM | POA: Insufficient documentation

## 2021-07-04 LAB — CT, NG, MYCOPLASMAS NAA, URINE
Chlamydia trachomatis, NAA: NEGATIVE
Mycoplasma genitalium NAA: NEGATIVE
Mycoplasma hominis NAA: NEGATIVE
Neisseria gonorrhoeae, NAA: NEGATIVE
Ureaplasma spp NAA: POSITIVE — AB

## 2021-07-04 LAB — HIV ANTIBODY (ROUTINE TESTING W REFLEX): HIV Screen 4th Generation wRfx: NONREACTIVE

## 2021-07-04 MED ORDER — DOXYCYCLINE HYCLATE 100 MG PO TABS: 100.0000 mg | ORAL_TABLET | Freq: Two times a day (BID) | ORAL | 0 refills | Status: AC

## 2021-07-04 NOTE — Chronic Care Management (AMB) (Signed)
  Care Management   Outreach Note  07/04/2021 Name: Jhordy Fazzino MRN: [id number] DOB: [birth year]  Referred by: Renee Rival, FNP Reason for referral : Care Coordination (Initial outreach to schedule referral with Licensed Clinical SW)   Third unsuccessful telephone outreach was attempted today. The patient was referred to the case management team for assistance with care management and care coordination. The patient's primary care provider has been notified of our unsuccessful attempts to make or maintain contact with the patient. The care management team is pleased to engage with this patient at any time in the future should he/she be interested in assistance from the care management team.   Follow Up Plan:  We have been unable to make contact with the patient for follow up. The care management team is available to follow up with the patient after provider conversation with the patient regarding recommendation for care management engagement and subsequent re-referral to the care management team. A HIPAA compliant phone message was left for the patient providing contact information and requesting a return call.   [place] Management  Direct Dial: [phone]

## 2021-07-04 NOTE — Progress Notes (Signed)
Positive for urea plasma spp.  Take doxycycline 100 mg twice daily for 7 days. Negative for chlamydia and trichomoniasis

## 2021-07-25 ENCOUNTER — Encounter: Payer: 59 | Admitting: Nurse Practitioner

## 2021-09-12 ENCOUNTER — Encounter: Payer: 59 | Admitting: Nurse Practitioner

## 2021-09-12 ENCOUNTER — Encounter: Payer: Self-pay | Admitting: Nurse Practitioner

## 2021-10-31 ENCOUNTER — Encounter: Payer: 59 | Admitting: Nurse Practitioner

## 2021-12-24 ENCOUNTER — Emergency Department (HOSPITAL_COMMUNITY): Payer: 59

## 2021-12-24 ENCOUNTER — Encounter (HOSPITAL_COMMUNITY): Payer: Self-pay

## 2021-12-24 ENCOUNTER — Emergency Department (HOSPITAL_COMMUNITY)
Admission: EM | Admit: 2021-12-24 | Discharge: 2021-12-24 | Disposition: A | Discharge status: Home / Self Care | Payer: 59 | Attending: Emergency Medicine | Admitting: Emergency Medicine

## 2021-12-24 ENCOUNTER — Other Ambulatory Visit: Payer: Self-pay

## 2021-12-24 DIAGNOSIS — M5459 Other low back pain: Secondary | ICD-10-CM | POA: Diagnosis not present

## 2021-12-24 DIAGNOSIS — M549 Dorsalgia, unspecified: Secondary | ICD-10-CM | POA: Diagnosis present

## 2021-12-24 DIAGNOSIS — M545 Low back pain, unspecified: Secondary | ICD-10-CM | POA: Diagnosis not present

## 2021-12-24 LAB — URINALYSIS, ROUTINE W REFLEX MICROSCOPIC
Bacteria, UA: NONE SEEN
Bilirubin Urine: NEGATIVE
Glucose, UA: NEGATIVE mg/dL
Ketones, ur: NEGATIVE mg/dL
Leukocytes,Ua: NEGATIVE
Nitrite: NEGATIVE
Protein, ur: NEGATIVE mg/dL
Specific Gravity, Urine: 1.02 (ref 1.005–1.030)
pH: 5 (ref 5.0–8.0)

## 2021-12-24 MED ORDER — NAPROXEN 250 MG PO TABS
500.0000 mg | ORAL_TABLET | Freq: Once | ORAL | Status: AC
  Administered 2021-12-24: 500 mg via ORAL
  Filled 2021-12-24: qty 2

## 2021-12-24 MED ORDER — NAPROXEN 500 MG PO TABS: 500.0000 mg | ORAL_TABLET | Freq: Two times a day (BID) | ORAL | 0 refills | Status: AC

## 2021-12-24 MED ORDER — CYCLOBENZAPRINE HCL 10 MG PO TABS: 10.0000 mg | ORAL_TABLET | Freq: Two times a day (BID) | ORAL | 0 refills | Status: AC | PRN
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
## 2021-12-24 NOTE — Discharge Instructions (Addendum)
Please rest, use cold or heat packs, avoid strenuous activity.  Please take tylenol/ibuprofen/naproxen for pain. I recommend close follow-up with PCP for reevaluation.  Please do not hesitate to return to emergency department if worrisome signs symptoms we discussed become apparent.

## 2021-12-24 NOTE — ED Triage Notes (Signed)
Pt presents to ED with complaints of lower back pain since yesterday, denies urinary symptoms.
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
## 2024-01-04 ENCOUNTER — Ambulatory Visit: Payer: Self-pay

## 2024-01-04 NOTE — Telephone Encounter (Signed)
 FYI Only or Action Required?: FYI only for provider: UC/ED advised.  Patient was last seen in primary care on 06/27/2021 by Paseda, Folashade R, FNP.  Called Nurse Triage reporting Chest Pain.  Symptoms began a week ago.  Interventions attempted: OTC medications: Ibuprofen .  Symptoms are: gradually worsening.  Triage Disposition: Go to ED Now (or PCP Triage)  Patient/caregiver understands and will follow disposition?: Yes       Copied from CRM [id number]. Topic: Clinical - Red Word Triage >> Jan 04, 2024 11:25 AM Wess RAMAN wrote: Red Word that prompted transfer to Nurse Triage: Pain in right side of chest, back, and side. Hurts to cough or take a deep breath and sit a certain way. Reason for Disposition  Taking a deep breath makes pain worse  Answer Assessment - Initial Assessment Questions 1. LOCATION: Where does it hurt?       R side of chest  2. RADIATION: Does the pain go anywhere else? (e.g., into neck, jaw, arms, back)     Goes to back and side as well 3. ONSET: When did the chest pain begin? (Minutes, hours or days)      Last week  4. PATTERN: Does the pain come and go, or has it been constant since it started?  Does it get worse with exertion?      Comes and goes and is constant now  6. SEVERITY: How bad is the pain?  (e.g., Scale 1-10; mild, moderate, or severe)     Pt states the pain is moderate now and becomes severe when taking a deep breath  7. CARDIAC RISK FACTORS: Do you have any history of heart problems or risk factors for heart disease? (e.g., angina, prior heart attack; diabetes, high blood pressure, high cholesterol, smoker, or strong family history of heart disease)     Denies  8. PULMONARY RISK FACTORS: Do you have any history of lung disease?  (e.g., blood clots in lung, asthma, emphysema, birth control pills)     Denies  9. CAUSE: What do you think is causing the chest pain?     Unsure  10. OTHER SYMPTOMS: Do you have any other  symptoms? (e.g., dizziness, nausea, vomiting, sweating, fever, difficulty breathing, cough)       Denies  Taking ibuprofen  for symptoms. Pt advised that he needs to be seen today for symptoms. Pt advised to go to ED/UC for symptoms. Pt states he will walk into nearest UC.  Protocols used: Chest Pain-A-AH
